# Patient Record
Sex: Female | Born: 1995
Health system: Southern US, Community
[De-identification: ages and names within clinical notes are randomized; demographics above are authoritative.]

## PROBLEM LIST (undated history)

## (undated) DIAGNOSIS — J45909 Unspecified asthma, uncomplicated: Secondary | ICD-10-CM

## (undated) DIAGNOSIS — K219 Gastro-esophageal reflux disease without esophagitis: Secondary | ICD-10-CM

## (undated) HISTORY — PX: ADENOIDECTOMY: SUR15

## (undated) HISTORY — PX: TONSILLECTOMY AND ADENOIDECTOMY: SUR1326

## (undated) HISTORY — PX: APPENDECTOMY: SHX54

---
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
## 2016-01-09 MED FILL — LARIN 24 FE 1 MG-20 MCG TAB: 1-20 | 84 days supply | Qty: 84 | Fill #0

## 2016-06-18 DIAGNOSIS — Z01419 Encounter for gynecological examination (general) (routine) without abnormal findings: Secondary | ICD-10-CM | POA: Diagnosis not present

## 2016-06-18 DIAGNOSIS — Z6839 Body mass index (BMI) 39.0-39.9, adult: Secondary | ICD-10-CM | POA: Diagnosis not present

## 2016-06-20 MED FILL — LARIN 24 FE 1 MG-20 MCG TAB: 1-20 | 84 days supply | Qty: 84 | Fill #0

## 2016-10-17 DIAGNOSIS — N62 Hypertrophy of breast: Secondary | ICD-10-CM | POA: Diagnosis not present

## 2016-10-17 DIAGNOSIS — J4520 Mild intermittent asthma, uncomplicated: Secondary | ICD-10-CM | POA: Diagnosis not present

## 2016-10-17 MED FILL — VENTOLIN HFA 90 MCG INHALER: 108 (90 BAS | 25 days supply | Qty: 18 | Fill #0

## 2017-01-19 DIAGNOSIS — L308 Other specified dermatitis: Secondary | ICD-10-CM | POA: Diagnosis not present

## 2017-05-13 DIAGNOSIS — N62 Hypertrophy of breast: Secondary | ICD-10-CM | POA: Diagnosis not present

## 2017-05-27 DIAGNOSIS — H5213 Myopia, bilateral: Secondary | ICD-10-CM | POA: Diagnosis not present

## 2017-06-19 DIAGNOSIS — Z6836 Body mass index (BMI) 36.0-36.9, adult: Secondary | ICD-10-CM | POA: Diagnosis not present

## 2017-06-19 DIAGNOSIS — Z01419 Encounter for gynecological examination (general) (routine) without abnormal findings: Secondary | ICD-10-CM | POA: Diagnosis not present

## 2017-06-19 MED FILL — NORETHIN-ESTRAD-FERR 1-0.02: 1-20 | 84 days supply | Qty: 84 | Fill #0

## 2018-02-04 DIAGNOSIS — J029 Acute pharyngitis, unspecified: Secondary | ICD-10-CM | POA: Diagnosis not present

## 2018-02-14 DIAGNOSIS — Z23 Encounter for immunization: Secondary | ICD-10-CM | POA: Diagnosis not present

## 2018-02-17 ENCOUNTER — Other Ambulatory Visit: Payer: Self-pay

## 2018-02-17 ENCOUNTER — Encounter (HOSPITAL_BASED_OUTPATIENT_CLINIC_OR_DEPARTMENT_OTHER): Payer: Self-pay | Admitting: *Deleted

## 2018-02-28 MED FILL — CEPHALEXIN 500 MG CAPSULE: 500 | 5 days supply | Qty: 20 | Fill #0

## 2018-03-03 MED FILL — HYDROCODON-APAP 5-325: 5-325 | 7 days supply | Qty: 28 | Fill #0

## 2018-03-05 ENCOUNTER — Encounter (HOSPITAL_BASED_OUTPATIENT_CLINIC_OR_DEPARTMENT_OTHER): Payer: Self-pay | Admitting: Plastic Surgery

## 2018-03-05 ENCOUNTER — Ambulatory Visit: Payer: Self-pay | Admitting: Plastic Surgery

## 2018-03-05 DIAGNOSIS — N62 Hypertrophy of breast: Secondary | ICD-10-CM

## 2018-03-05 NOTE — H&P (Signed)
Tracy Yoder is an 22 y.o. female.   Chief Complaint: mammary hypertrophy HPI: Tracy Yoder is a 22 y.o. female here for pre operative history and physical prior to bilateral breast reduction.  She has a  history of mammary hyperplasia for several years.  She has extremely large breasts causing symptoms that include the following: Back pain (upper and lower) and neck pain. She frequently pins bra cups higher on straps for better lift and relief. Notices relief when holding breast up in her hands. Shoulder straps causing grooves, pain occasionally requiring padding. Pain medication is sometimes required with motrin and tylenol.  Activities that are hindered by enlarged breasts include: running and heavy exercise.  Has seen her PCP for the neck and back pain several times.  Her breasts are extremely large and fairly symmetric.  She has hyperpigmentation of the inframammary area on both sides.  The sternal to nipple distance on the right is 36 and the left is 37.  The IMF distance is 13 cm.  She is 5 feet 1 inches tall and weighs 200 pounds.  Preoperative bra size = 38DDD cup.  The estimated excess breast tissue to be removed at the time of surgery = 600 grams on the left and 600 grams on the right.  Mammogram history: none and no family history of breast cancer.  Past Medical History:  Diagnosis Date  . Asthma   . GERD (gastroesophageal reflux disease)    Past Surgical History:  Procedure Laterality Date  . APPENDECTOMY    . TONSILLECTOMY AND ADENOIDECTOMY      History reviewed. No pertinent family history. Social History:  reports that she has never smoked. She has never used smokeless tobacco. She reports that she does not drink alcohol or use drugs. Allergies: No Known Allergies  No medications prior to admission.    No results found for this or any previous visit (from the past 48 hour(s)). No results found.  Review of Systems  Constitutional: Negative.   HENT: Negative.    Eyes: Negative.   Respiratory: Negative.   Cardiovascular: Negative.   Gastrointestinal: Negative.   Genitourinary: Negative.   Musculoskeletal: Positive for back pain and neck pain.  Skin: Negative.   Neurological: Negative.   Endo/Heme/Allergies: Negative.   Psychiatric/Behavioral: Negative.     Height  (1.575 m), weight 81.6 kg (180 lb), last menstrual period 02/03/2018. Physical Exam  Constitutional: She is oriented to person, place, and time. She appears well-developed and well-nourished.  HENT:  Head: Normocephalic and atraumatic.  Eyes: Pupils are equal, round, and reactive to light. EOM are normal.  Cardiovascular: Normal rate.  Respiratory: Effort normal. No respiratory distress.  GI: Soft. She exhibits no distension.  Musculoskeletal: Normal range of motion.  Neurological: She is alert and oriented to person, place, and time.  Skin: Skin is warm. No rash noted. No erythema. No pallor.  Psychiatric: She has a normal mood and affect. Judgment and thought content normal.     Assessment/Plan Plan for bilateral breast reduction.  Risks and complications were discussed and mom and patient agree to proceed.  [name], DO 03/05/2018, 7:51 AM

## 2018-03-06 ENCOUNTER — Encounter (HOSPITAL_BASED_OUTPATIENT_CLINIC_OR_DEPARTMENT_OTHER): Payer: Self-pay | Admitting: *Deleted

## 2018-03-06 ENCOUNTER — Encounter (HOSPITAL_BASED_OUTPATIENT_CLINIC_OR_DEPARTMENT_OTHER)
Admission: RE | Disposition: A | Discharge status: Home / Self Care | Payer: Self-pay | Source: Ambulatory Visit | Attending: Plastic Surgery

## 2018-03-06 ENCOUNTER — Other Ambulatory Visit: Payer: Self-pay

## 2018-03-06 ENCOUNTER — Ambulatory Visit (HOSPITAL_BASED_OUTPATIENT_CLINIC_OR_DEPARTMENT_OTHER): Payer: 59 | Admitting: Anesthesiology

## 2018-03-06 ENCOUNTER — Ambulatory Visit (HOSPITAL_BASED_OUTPATIENT_CLINIC_OR_DEPARTMENT_OTHER)
Admission: RE | Admit: 2018-03-06 | Discharge: 2018-03-06 | Disposition: A | Discharge status: Home / Self Care | Payer: 59 | Source: Ambulatory Visit | Attending: Plastic Surgery | Admitting: Plastic Surgery

## 2018-03-06 DIAGNOSIS — Z793 Long term (current) use of hormonal contraceptives: Secondary | ICD-10-CM | POA: Insufficient documentation

## 2018-03-06 DIAGNOSIS — M549 Dorsalgia, unspecified: Secondary | ICD-10-CM | POA: Diagnosis not present

## 2018-03-06 DIAGNOSIS — M542 Cervicalgia: Secondary | ICD-10-CM | POA: Diagnosis not present

## 2018-03-06 DIAGNOSIS — N62 Hypertrophy of breast: Secondary | ICD-10-CM | POA: Diagnosis not present

## 2018-03-06 HISTORY — PX: BREAST REDUCTION SURGERY: SHX8

## 2018-03-06 HISTORY — DX: Gastro-esophageal reflux disease without esophagitis: K21.9

## 2018-03-06 HISTORY — DX: Unspecified asthma, uncomplicated: J45.909

## 2018-03-06 SURGERY — MAMMOPLASTY, REDUCTION
Anesthesia: General | Site: Breast | Laterality: Bilateral

## 2018-03-06 MED ORDER — MIDAZOLAM HCL 2 MG/2ML IJ SOLN
INTRAMUSCULAR | Status: AC
  Filled 2018-03-06: qty 2

## 2018-03-06 MED ORDER — PROPOFOL 10 MG/ML IV BOLUS
INTRAVENOUS | Status: DC | PRN
  Administered 2018-03-06: 200 mg via INTRAVENOUS

## 2018-03-06 MED ORDER — SCOPOLAMINE 1 MG/3DAYS TD PT72: 1.0000 | MEDICATED_PATCH | TRANSDERMAL | Status: DC

## 2018-03-06 MED ORDER — LACTATED RINGERS IV SOLN: 500.0000 mL | INTRAVENOUS | Status: DC

## 2018-03-06 MED ORDER — SUFENTANIL CITRATE 50 MCG/ML IV SOLN
INTRAVENOUS | Status: AC
  Filled 2018-03-06: qty 1

## 2018-03-06 MED ORDER — EPHEDRINE SULFATE 50 MG/ML IJ SOLN
INTRAMUSCULAR | Status: DC | PRN
  Administered 2018-03-06: 10 mg via INTRAVENOUS

## 2018-03-06 MED ORDER — SCOPOLAMINE 1 MG/3DAYS TD PT72
MEDICATED_PATCH | TRANSDERMAL | Status: AC
  Filled 2018-03-06: qty 1

## 2018-03-06 MED ORDER — CEFAZOLIN SODIUM-DEXTROSE 2-4 GM/100ML-% IV SOLN
INTRAVENOUS | Status: AC
  Filled 2018-03-06: qty 100

## 2018-03-06 MED ORDER — DEXAMETHASONE SODIUM PHOSPHATE 10 MG/ML IJ SOLN
INTRAMUSCULAR | Status: AC
  Filled 2018-03-06: qty 1

## 2018-03-06 MED ORDER — POLYMYXIN B SULFATE 500000 UNITS IJ SOLR
INTRAMUSCULAR | Status: DC | PRN
  Administered 2018-03-06: 500 mL

## 2018-03-06 MED ORDER — SUGAMMADEX SODIUM 200 MG/2ML IV SOLN
INTRAVENOUS | Status: AC
  Filled 2018-03-06: qty 2

## 2018-03-06 MED ORDER — DEXAMETHASONE SODIUM PHOSPHATE 4 MG/ML IJ SOLN
INTRAMUSCULAR | Status: DC | PRN
  Administered 2018-03-06: 10 mg via INTRAVENOUS

## 2018-03-06 MED ORDER — FENTANYL CITRATE (PF) 100 MCG/2ML IJ SOLN: 50.0000 ug | INTRAMUSCULAR | Status: DC | PRN

## 2018-03-06 MED ORDER — MIDAZOLAM HCL 2 MG/2ML IJ SOLN
1.0000 mg | INTRAMUSCULAR | Status: DC | PRN
  Administered 2018-03-06: 2 mg via INTRAVENOUS

## 2018-03-06 MED ORDER — HYDROMORPHONE HCL 1 MG/ML IJ SOLN
INTRAMUSCULAR | Status: AC
  Filled 2018-03-06: qty 0.5

## 2018-03-06 MED ORDER — PROPOFOL 500 MG/50ML IV EMUL
INTRAVENOUS | Status: AC
  Filled 2018-03-06: qty 50

## 2018-03-06 MED ORDER — SCOPOLAMINE 1 MG/3DAYS TD PT72
1.0000 | MEDICATED_PATCH | Freq: Once | TRANSDERMAL | Status: DC | PRN
  Administered 2018-03-06: 1.5 mg via TRANSDERMAL

## 2018-03-06 MED ORDER — LACTATED RINGERS IV SOLN
INTRAVENOUS | Status: DC
  Administered 2018-03-06 (×2): via INTRAVENOUS

## 2018-03-06 MED ORDER — ONDANSETRON HCL 4 MG/2ML IJ SOLN
INTRAMUSCULAR | Status: AC
  Filled 2018-03-06: qty 2

## 2018-03-06 MED ORDER — SUFENTANIL CITRATE 50 MCG/ML IV SOLN
INTRAVENOUS | Status: DC | PRN
  Administered 2018-03-06 (×5): 10 ug via INTRAVENOUS

## 2018-03-06 MED ORDER — LIDOCAINE HCL (CARDIAC) PF 100 MG/5ML IV SOSY
PREFILLED_SYRINGE | INTRAVENOUS | Status: DC | PRN
  Administered 2018-03-06: 100 mg via INTRAVENOUS

## 2018-03-06 MED ORDER — BUPIVACAINE-EPINEPHRINE (PF) 0.25% -1:200000 IJ SOLN
INTRAMUSCULAR | Status: AC
  Filled 2018-03-06: qty 60

## 2018-03-06 MED ORDER — LIDOCAINE HCL (CARDIAC) PF 100 MG/5ML IV SOSY
PREFILLED_SYRINGE | INTRAVENOUS | Status: AC
  Filled 2018-03-06: qty 5

## 2018-03-06 MED ORDER — BUPIVACAINE-EPINEPHRINE 0.25% -1:200000 IJ SOLN
INTRAMUSCULAR | Status: DC | PRN
  Administered 2018-03-06: 30 mL

## 2018-03-06 MED ORDER — ROCURONIUM BROMIDE 100 MG/10ML IV SOLN
INTRAVENOUS | Status: DC | PRN
  Administered 2018-03-06: 70 mg via INTRAVENOUS
  Administered 2018-03-06: 20 mg via INTRAVENOUS

## 2018-03-06 MED ORDER — ROCURONIUM BROMIDE 10 MG/ML (PF) SYRINGE
PREFILLED_SYRINGE | INTRAVENOUS | Status: AC
  Filled 2018-03-06: qty 5

## 2018-03-06 MED ORDER — PROMETHAZINE HCL 25 MG/ML IJ SOLN: 6.2500 mg | INTRAMUSCULAR | Status: DC | PRN

## 2018-03-06 MED ORDER — SUGAMMADEX SODIUM 200 MG/2ML IV SOLN
INTRAVENOUS | Status: DC | PRN
  Administered 2018-03-06: 200 mg via INTRAVENOUS

## 2018-03-06 MED ORDER — SODIUM CHLORIDE 0.9 % IV SOLN
250.0000 mL | INTRAVENOUS | Status: DC | PRN
Start: 2018-03-06 — End: 2018-03-06

## 2018-03-06 MED ORDER — HYDROMORPHONE HCL 1 MG/ML IJ SOLN
0.2500 mg | INTRAMUSCULAR | Status: DC | PRN
  Administered 2018-03-06: 0.5 mg via INTRAVENOUS

## 2018-03-06 MED ORDER — OXYCODONE HCL 5 MG PO TABS
ORAL_TABLET | ORAL | Status: AC
  Filled 2018-03-06: qty 2

## 2018-03-06 MED ORDER — SODIUM CHLORIDE 0.9 % IV SOLN
INTRAVENOUS | Status: AC | PRN
  Administered 2018-03-06: 100 mL

## 2018-03-06 MED ORDER — OXYCODONE HCL 5 MG PO TABS
5.0000 mg | ORAL_TABLET | ORAL | Status: DC | PRN
Start: 2018-03-06 — End: 2018-03-06
  Administered 2018-03-06: 10 mg via ORAL

## 2018-03-06 MED ORDER — EPHEDRINE SULFATE 50 MG/ML IJ SOLN
INTRAMUSCULAR | Status: AC
  Filled 2018-03-06: qty 1

## 2018-03-06 MED ORDER — LIDOCAINE HCL (PF) 1 % IJ SOLN
INTRAMUSCULAR | Status: AC
  Filled 2018-03-06: qty 30

## 2018-03-06 MED ORDER — SODIUM CHLORIDE 0.9% FLUSH: 3.0000 mL | INTRAVENOUS | Status: DC | PRN

## 2018-03-06 MED ORDER — EPINEPHRINE 30 MG/30ML IJ SOLN
INTRAMUSCULAR | Status: AC
Start: 2018-03-06 — End: 2018-03-06
  Filled 2018-03-06: qty 1

## 2018-03-06 MED ORDER — ONDANSETRON HCL 4 MG/2ML IJ SOLN
INTRAMUSCULAR | Status: DC | PRN
Start: 2018-03-06 — End: 2018-03-06
  Administered 2018-03-06: 4 mg via INTRAVENOUS

## 2018-03-06 MED ORDER — CEFAZOLIN SODIUM-DEXTROSE 2-4 GM/100ML-% IV SOLN
2.0000 g | INTRAVENOUS | Status: AC
  Administered 2018-03-06: 2 g via INTRAVENOUS

## 2018-03-06 MED ORDER — PHENYLEPHRINE 40 MCG/ML (10ML) SYRINGE FOR IV PUSH (FOR BLOOD PRESSURE SUPPORT)
PREFILLED_SYRINGE | INTRAVENOUS | Status: AC
  Filled 2018-03-06: qty 10

## 2018-03-06 MED ORDER — SUCCINYLCHOLINE CHLORIDE 200 MG/10ML IV SOSY
PREFILLED_SYRINGE | INTRAVENOUS | Status: AC
  Filled 2018-03-06: qty 10

## 2018-03-06 MED ORDER — SODIUM CHLORIDE 0.9% FLUSH: 3.0000 mL | Freq: Two times a day (BID) | INTRAVENOUS | Status: DC

## 2018-03-06 SURGICAL SUPPLY — 65 items
ADH SKN CLS APL DERMABOND .7 (GAUZE/BANDAGES/DRESSINGS) ×2
BAG DECANTER FOR FLEXI CONT (MISCELLANEOUS) ×3 IMPLANT
BINDER BREAST LRG (GAUZE/BANDAGES/DRESSINGS) IMPLANT
BINDER BREAST MEDIUM (GAUZE/BANDAGES/DRESSINGS) IMPLANT
BINDER BREAST XLRG (GAUZE/BANDAGES/DRESSINGS) IMPLANT
BINDER BREAST XXLRG (GAUZE/BANDAGES/DRESSINGS) IMPLANT
BIOPATCH RED 1 DISK 7.0 (GAUZE/BANDAGES/DRESSINGS) IMPLANT
BIOPATCH RED 1IN DISK 7.0MM (GAUZE/BANDAGES/DRESSINGS)
BLADE HEX COATED 2.75 (ELECTRODE) ×3 IMPLANT
BLADE KNIFE PERSONA 10 (BLADE) ×8 IMPLANT
BLADE SURG 15 STRL LF DISP TIS (BLADE) IMPLANT
BLADE SURG 15 STRL SS (BLADE) ×3
BNDG GAUZE ELAST 4 BULKY (GAUZE/BANDAGES/DRESSINGS) ×6 IMPLANT
CANISTER SUCT 1200ML W/VALVE (MISCELLANEOUS) ×3 IMPLANT
CHLORAPREP W/TINT 26ML (MISCELLANEOUS) ×3 IMPLANT
COVER BACK TABLE 60X90IN (DRAPES) ×3 IMPLANT
COVER MAYO STAND STRL (DRAPES) ×3 IMPLANT
DECANTER SPIKE VIAL GLASS SM (MISCELLANEOUS) IMPLANT
DERMABOND ADVANCED (GAUZE/BANDAGES/DRESSINGS) ×4
DERMABOND ADVANCED .7 DNX12 (GAUZE/BANDAGES/DRESSINGS) IMPLANT
DRAIN CHANNEL 19F RND (DRAIN) IMPLANT
DRAPE LAPAROSCOPIC ABDOMINAL (DRAPES) ×3 IMPLANT
DRSG PAD ABDOMINAL 8X10 ST (GAUZE/BANDAGES/DRESSINGS) ×6 IMPLANT
ELECT BLADE 4.0 EZ CLEAN MEGAD (MISCELLANEOUS)
ELECT REM PT RETURN 9FT ADLT (ELECTROSURGICAL) ×3
ELECTRODE BLDE 4.0 EZ CLN MEGD (MISCELLANEOUS) IMPLANT
ELECTRODE REM PT RTRN 9FT ADLT (ELECTROSURGICAL) ×1 IMPLANT
EVACUATOR SILICONE 100CC (DRAIN) ×4 IMPLANT
GAUZE SPONGE 4X4 12PLY STRL LF (GAUZE/BANDAGES/DRESSINGS) ×3 IMPLANT
GLOVE BIO SURGEON STRL SZ 6.5 (GLOVE) ×6 IMPLANT
GLOVE BIO SURGEONS STRL SZ 6.5 (GLOVE) ×3
GOWN STRL REUS W/ TWL LRG LVL3 (GOWN DISPOSABLE) ×3 IMPLANT
GOWN STRL REUS W/TWL LRG LVL3 (GOWN DISPOSABLE) ×9
NDL HYPO 25X1 1.5 SAFETY (NEEDLE) ×1 IMPLANT
NDL SAFETY ECLIPSE 18X1.5 (NEEDLE) IMPLANT
NEEDLE HYPO 18GX1.5 SHARP (NEEDLE)
NEEDLE HYPO 25X1 1.5 SAFETY (NEEDLE) ×3 IMPLANT
NS IRRIG 1000ML POUR BTL (IV SOLUTION) IMPLANT
PACK BASIN DAY SURGERY FS (CUSTOM PROCEDURE TRAY) ×3 IMPLANT
PAD ALCOHOL SWAB (MISCELLANEOUS) IMPLANT
PENCIL BUTTON HOLSTER BLD 10FT (ELECTRODE) ×3 IMPLANT
PIN SAFETY STERILE (MISCELLANEOUS) ×4 IMPLANT
SLEEVE SCD COMPRESS KNEE MED (MISCELLANEOUS) ×3 IMPLANT
SPONGE LAP 18X18 RF (DISPOSABLE) ×8 IMPLANT
STRIP SUTURE WOUND CLOSURE 1/2 (SUTURE) ×6 IMPLANT
SUT MNCRL AB 4-0 PS2 18 (SUTURE) ×8 IMPLANT
SUT MON AB 3-0 SH 27 (SUTURE) ×9
SUT MON AB 3-0 SH27 (SUTURE) ×1 IMPLANT
SUT MON AB 5-0 PS2 18 (SUTURE) ×6 IMPLANT
SUT PDS 3-0 CT2 (SUTURE)
SUT PDS AB 2-0 CT2 27 (SUTURE) IMPLANT
SUT PDS II 3-0 CT2 27 ABS (SUTURE) IMPLANT
SUT SILK 3 0 PS 1 (SUTURE) IMPLANT
SYR 3ML 23GX1 SAFETY (SYRINGE) ×1 IMPLANT
SYR 50ML LL SCALE MARK (SYRINGE) IMPLANT
SYR BULB IRRIGATION 50ML (SYRINGE) ×3 IMPLANT
SYR CONTROL 10ML LL (SYRINGE) ×3 IMPLANT
TAPE MEASURE VINYL STERILE (MISCELLANEOUS) ×3 IMPLANT
TOWEL OR 17X24 6PK STRL BLUE (TOWEL DISPOSABLE) ×6 IMPLANT
TUBE CONNECTING 20'X1/4 (TUBING) ×1
TUBE CONNECTING 20X1/4 (TUBING) ×2 IMPLANT
TUBING INFILTRATION IT-10001 (TUBING) IMPLANT
TUBING SET GRADUATE ASPIR 12FT (MISCELLANEOUS) ×2 IMPLANT
UNDERPAD 30X30 (UNDERPADS AND DIAPERS) ×6 IMPLANT
YANKAUER SUCT BULB TIP NO VENT (SUCTIONS) ×3 IMPLANT

## 2018-03-06 NOTE — Interval H&P Note (Signed)
History and Physical Interval Note:  03/06/2018 7:10 AM  Tracy Yoder  has presented today for surgery, with the diagnosis of MAMMARY  HYPERTROPHY  The various methods of treatment have been discussed with the patient and family. After consideration of risks, benefits and other options for treatment, the patient has consented to  Procedure(s): BILATERAL MAMMARY REDUCTION  (BREAST) (Bilateral) as a surgical intervention .  The patient's history has been reviewed, patient examined, no change in status, stable for surgery.  I have reviewed the patient's chart and labs.  Questions were answered to the patient's satisfaction.     [name]

## 2018-03-06 NOTE — Anesthesia Preprocedure Evaluation (Addendum)
Anesthesia Evaluation  Patient identified by MRN, date of birth, ID band Patient awake    Reviewed: Allergy & Precautions, NPO status , Patient's Chart, lab work & pertinent test results  History of Anesthesia Complications Negative for: history of anesthetic complications  Airway Mallampati: II  TM Distance: >3 FB Neck ROM: Full    Dental no notable dental hx. (+) Dental Advisory Given   Pulmonary asthma ,    Pulmonary exam normal        Cardiovascular negative cardio ROS Normal cardiovascular exam     Neuro/Psych negative neurological ROS     GI/Hepatic Neg liver ROS, GERD  ,  Endo/Other  negative endocrine ROS  Renal/GU negative Renal ROS     Musculoskeletal negative musculoskeletal ROS (+)   Abdominal   Peds  Hematology negative hematology ROS (+)   Anesthesia Other Findings Day of surgery medications reviewed with the patient.  Reproductive/Obstetrics                            Anesthesia Physical Anesthesia Plan  ASA: II  Anesthesia Plan: General   Post-op Pain Management:    Induction: Intravenous  PONV Risk Score and Plan: 3 and Ondansetron, Dexamethasone and Scopolamine patch - Pre-op  Airway Management Planned: Oral ETT  Additional Equipment:   Intra-op Plan:   Post-operative Plan: Extubation in OR  Informed Consent: I have reviewed the patients History and Physical, chart, labs and discussed the procedure including the risks, benefits and alternatives for the proposed anesthesia with the patient or authorized representative who has indicated his/her understanding and acceptance.   Dental advisory given  Plan Discussed with: Anesthesiologist, CRNA and Surgeon  Anesthesia Plan Comments:        Anesthesia Quick Evaluation

## 2018-03-06 NOTE — Anesthesia Procedure Notes (Signed)

## 2018-03-06 NOTE — Discharge Instructions (Addendum)
No heavy lifting. Skin bath or baby wipes. Continue binder or sports bra. Oxy  received at 51 Noon!!!!!  Post Anesthesia Home Care Instructions  Activity: Get plenty of rest for the remainder of the day. A responsible individual must stay with you for 24 hours following the procedure.  For the next 24 hours, DO NOT: -Drive a car -Advertising copywriter -Drink alcoholic beverages -Take any medication unless instructed by your physician -Make any legal decisions or sign important papers.  Meals: Start with liquid foods such as gelatin or soup. Progress to regular foods as tolerated. Avoid greasy, spicy, heavy foods. If nausea and/or vomiting occur, drink only clear liquids until the nausea and/or vomiting subsides. Call your physician if vomiting continues.  Special Instructions/Symptoms: Your throat may feel dry or sore from the anesthesia or the breathing tube placed in your throat during surgery. If this causes discomfort, gargle with warm salt water. The discomfort should disappear within 24 hours.  If you had a scopolamine patch placed behind your ear for the management of post- operative nausea and/or vomiting:  1. The medication in the patch is effective for 72 hours, after which it should be removed.  Wrap patch in a tissue and discard in the trash. Wash hands thoroughly with soap and water. 2. You may remove the patch earlier than 72 hours if you experience unpleasant side effects which may include dry mouth, dizziness or visual disturbances. 3. Avoid touching the patch. Wash your hands with soap and water after contact with the patch.  About my Jackson-Pratt Bulb Drain  What is a Jackson-Pratt bulb? A Jackson-Pratt is a soft, round device used to collect drainage. It is connected to a long, thin drainage catheter, which is held in place by one or two small stiches near your surgical incision site. When the bulb is squeezed, it forms a vacuum, forcing the drainage to empty into  the bulb.  Emptying the Jackson-Pratt bulb- To empty the bulb: 1. Release the plug on the top of the bulb. 2. Pour the bulb's contents into a measuring container which your nurse will provide. 3. Record the time emptied and amount of drainage. Empty the drain(s) as often as your     doctor or nurse recommends.  Date                  Time                    Amount (Drain 1)                 Amount (Drain 2)  _____________________________________________________________________  _____________________________________________________________________  _____________________________________________________________________  _____________________________________________________________________  _____________________________________________________________________  _____________________________________________________________________  _____________________________________________________________________  _____________________________________________________________________  Squeezing the Jackson-Pratt Bulb- To squeeze the bulb: 1. Make sure the plug at the top of the bulb is open. 2. Squeeze the bulb tightly in your fist. You will hear air squeezing from the bulb. 3. Replace the plug while the bulb is squeezed. 4. Use a safety pin to attach the bulb to your clothing. This will keep the catheter from     pulling at the bulb insertion site.  When to call your doctor- Call your doctor if:  Drain site becomes red, swollen or hot.  You have a fever greater than 101 degrees F.  There is oozing at the drain site.  Drain falls out (apply a guaze bandage over the drain hole and secure it with tape).  Drainage increases daily not related to activity patterns. (You  will usually have more drainage when you are active than when you are resting.)  Drainage has a bad odor.

## 2018-03-06 NOTE — Transfer of Care (Signed)
Immediate Anesthesia Transfer of Care Note  Patient: Tracy Yoder  Procedure(s) Performed: BILATERAL MAMMARY REDUCTION  (BREAST) (Bilateral Breast)  Patient Location: PACU  Anesthesia Type:General  Level of Consciousness: awake, alert  and oriented  Airway & Oxygen Therapy: Patient Spontanous Breathing and Patient connected to face mask oxygen  Post-op Assessment: Report given to RN and Post -op Vital signs reviewed and stable  Post vital signs: Reviewed and stable  Last Vitals:  Vitals Value Taken Time  BP 157/85 03/06/2018 11:02 AM  Temp    Pulse 130 03/06/2018 11:06 AM  Resp 14 03/06/2018 11:06 AM  SpO2 97 % 03/06/2018 11:06 AM  Vitals shown include unvalidated device data.  Last Pain:  Vitals:   03/06/18 0641  TempSrc: Oral         Complications: No apparent anesthesia complications

## 2018-03-06 NOTE — Anesthesia Postprocedure Evaluation (Signed)
Anesthesia Post Note  Patient: Tracy Yoder  Procedure(s) Performed: BILATERAL MAMMARY REDUCTION  (BREAST) (Bilateral Breast)     Patient location during evaluation: PACU Anesthesia Type: General Level of consciousness: sedated Pain management: pain level controlled Vital Signs Assessment: post-procedure vital signs reviewed and stable Respiratory status: spontaneous breathing and respiratory function stable Cardiovascular status: stable Postop Assessment: no apparent nausea or vomiting Anesthetic complications: no    Last Vitals:  Vitals:   03/06/18 1145 03/06/18 1159  BP: 127/86 110/88  Pulse: (!) 114 (!) 59  Resp: (!) 23 18  Temp:  36.9 C  SpO2: 99% 100%    Last Pain:  Vitals:   03/06/18 1159  TempSrc:   PainSc: 3                  Tracy Yoder

## 2018-03-06 NOTE — Op Note (Addendum)
Breast Reduction Op note:    DATE OF PROCEDURE: 03/06/2018  LOCATION: [facility]  SURGEON: Alan Ripper Sanger Dillingham, DO  ASSISTANT: Shawn Rayburn, PA  PREOPERATIVE DIAGNOSIS 1. Macromastia 2. Neck Pain 3. Back Pain  POSTOPERATIVE DIAGNOSIS 1. Macromastia 2. Neck Pain 3. Back Pain  PROCEDURES 1. Bilateral breast reduction.  Right reduction 801g, Left reduction 825g  COMPLICATIONS: None.  DRAINS: bilateral  INDICATIONS FOR PROCEDURE Tracy Yoder is a 22 y.o. year-old female born on [birth year],with a history of symptomatic macromastia with concominant back pain, neck pain, shoulder grooving from her bra.   MRN: [id number]  CONSENT Informed consent was obtained directly from the patient. The risks, benefits and alternatives were fully discussed. Specific risks including but not limited to bleeding, infection, hematoma, seroma, scarring, pain, nipple necrosis, asymmetry, poor cosmetic results, and need for further surgery were discussed. The patient had ample opportunity to have her questions answered to her satisfaction.  DESCRIPTION OF PROCEDURE  Patient was brought into the operating room and placed in a supine position.  SCDs were placed and appropriate padding was performed.  Antibiotics were given. The patient underwent general anesthesia and the chest was prepped and draped in a sterile fashion.  A timeout was performed and all information was confirmed to be correct.  Right side: Preoperative markings were confirmed.  Incision lines were injected with 1% Xylocaine with epinephrine.  After waiting for vasoconstriction, the marked lines were incised.  A Wise-pattern superomedial breast reduction was performed by de-epithelializing the pedicle, using bovie to create the superomedial pedicle, and removing breast tissue from the superior, lateral, and inferior portions of the breast.  Liposuction was done to the lateral inferior area. Care was taken to  not undermine the breast pedicle. Hemostasis was achieved.  The nipple was gently rotated into position and the soft tissue closed with 4-0 Monocryl.   The pocket was irrigated and hemostasis confirmed.  The deep tissues were approximated with 3-0 monocryl sutures and the skin was closed with deep dermal and subcuticular 4-0 Monocryl sutures.  The nipple and skin flaps had good capillary refill at the end of the procedure.    Left side: Preoperative markings were confirmed.  Incision lines were injected with 1% Xylocaine with epinephrine.  After waiting for vasoconstriction, the marked lines were incised.  A Wise-pattern superomedial breast reduction was performed by de-epithelializing the pedicle, using bovie to create the superomedial pedicle, and removing breast tissue from the superior, lateral, and inferior portions of the breast.  Care was taken to not undermine the breast pedicle. Hemostasis was achieved.  The nipple was gently rotated into position and the soft tissue was closed with 4-0 Monocryl.  The patient was sat upright and size and shape symmetry was confirmed.  The pocket was irrigated and hemostasis confirmed.  The deep tissues were approximated with 3-0 monocryl sutures and the skin was closed with deep dermal and subcuticular 4-0 Monocryl sutures.  Dermabond was applied.  A breast binder and ABDs were placed.  The nipple and skin flaps had good capillary refill at the end of the procedure.  The patient tolerated the procedure well. The patient was allowed to wake from anesthesia and taken to the recovery room in satisfactory condition. The advanced practice practitioner assisted throughout the case.  The APP was essential in retraction and counter traction when needed to make the case progress smoothly.  The assistance was needed for blood control, tissue re-approximation and assisted with closure of the incision site.

## 2018-03-07 ENCOUNTER — Encounter (HOSPITAL_BASED_OUTPATIENT_CLINIC_OR_DEPARTMENT_OTHER): Payer: Self-pay | Admitting: Plastic Surgery

## 2018-03-07 NOTE — Op Note (Signed)
First Assist Op Note: [facility] I assisted the Surgeon(s) ___Dr. Alan Ripper Dillingham__ on the procedure(s): ___Bilateral breast reduction _______on Date ___5/02/19______  I provided my assistance on this case as follows:  I was present and acted as first Geophysicist/field seismologist during this operation. I was present during the patient transport into the operative suite and assisted the OR staff with transferring and positioning of the patient. All extremities were checked and properly cushioned and safety straps in place. I was involved in the prepping and placement of sterile drapes. A time out was performed and all information confirmed to be correct.  I first assisted during the case including retraction for exposure, assisting with closure of surgical wounds and application of sterile dressings. I provided assistance with application of post operative garments/splinting and assisted with patient transfer back to the stretcher as needed.   Fritz Cauthon,PA-C Plastic Surgery [phone]

## 2018-03-12 MED FILL — HYDROCODON-APAP 5-325: 5-325 | 7 days supply | Qty: 28 | Fill #0

## 2018-04-16 DIAGNOSIS — H5213 Myopia, bilateral: Secondary | ICD-10-CM | POA: Diagnosis not present

## 2018-06-20 DIAGNOSIS — Z01419 Encounter for gynecological examination (general) (routine) without abnormal findings: Secondary | ICD-10-CM | POA: Diagnosis not present

## 2018-06-20 DIAGNOSIS — N390 Urinary tract infection, site not specified: Secondary | ICD-10-CM | POA: Diagnosis not present

## 2018-06-20 DIAGNOSIS — Z6836 Body mass index (BMI) 36.0-36.9, adult: Secondary | ICD-10-CM | POA: Diagnosis not present

## 2018-06-20 DIAGNOSIS — Z113 Encounter for screening for infections with a predominantly sexual mode of transmission: Secondary | ICD-10-CM | POA: Diagnosis not present

## 2018-06-20 DIAGNOSIS — B373 Candidiasis of vulva and vagina: Secondary | ICD-10-CM | POA: Diagnosis not present

## 2018-06-20 MED FILL — FLUCONAZOLE 150 MG TABS: 150 | 1 days supply | Qty: 1 | Fill #0

## 2018-06-23 ENCOUNTER — Other Ambulatory Visit: Payer: Self-pay | Admitting: Obstetrics and Gynecology

## 2018-06-23 DIAGNOSIS — N6320 Unspecified lump in the left breast, unspecified quadrant: Secondary | ICD-10-CM

## 2018-06-30 ENCOUNTER — Other Ambulatory Visit: Payer: 59

## 2018-07-18 DIAGNOSIS — Z3202 Encounter for pregnancy test, result negative: Secondary | ICD-10-CM | POA: Diagnosis not present

## 2018-07-18 DIAGNOSIS — Z30430 Encounter for insertion of intrauterine contraceptive device: Secondary | ICD-10-CM | POA: Diagnosis not present

## 2018-08-08 DIAGNOSIS — N926 Irregular menstruation, unspecified: Secondary | ICD-10-CM | POA: Diagnosis not present

## 2018-08-08 DIAGNOSIS — R102 Pelvic and perineal pain: Secondary | ICD-10-CM | POA: Diagnosis not present

## 2018-08-08 DIAGNOSIS — Z30431 Encounter for routine checking of intrauterine contraceptive device: Secondary | ICD-10-CM | POA: Diagnosis not present

## 2018-09-05 ENCOUNTER — Encounter: Payer: Self-pay | Admitting: Plastic Surgery

## 2018-09-05 ENCOUNTER — Ambulatory Visit (INDEPENDENT_AMBULATORY_CARE_PROVIDER_SITE_OTHER): Payer: 59 | Admitting: Plastic Surgery

## 2018-09-05 ENCOUNTER — Telehealth: Payer: Self-pay | Admitting: Plastic Surgery

## 2018-09-05 VITALS — BP 124/70 | HR 83 | Resp 13 | Ht 61.0 in | Wt 190.0 lb

## 2018-09-05 DIAGNOSIS — Z9889 Other specified postprocedural states: Secondary | ICD-10-CM | POA: Diagnosis not present

## 2018-09-05 DIAGNOSIS — N641 Fat necrosis of breast: Secondary | ICD-10-CM | POA: Insufficient documentation

## 2018-09-05 NOTE — Telephone Encounter (Signed)
Patient's mother called and wants to know what options for surgery there are for the patient. Patient's mother also requesting that if surgery is decided on that it be before the end of the year.

## 2018-09-05 NOTE — Progress Notes (Signed)
     Patient ID: Tracy Yoder, female    DOB: 06-Aug-1996, 22 y.o.   MRN: [id number]   Chief Complaint  Patient presents with  . Breast Problem    The patient is a 22 year old white female here for further evaluation of her bilateral breast reduction.  She had the surgery in May and overall is very happy with her breasts and the way that they look.  She saw her OB/GYN recently and the doctor noticed to firm areas in the left breast.  We have been watching these and they are most likely fat necrosis.  However they have not gotten softer.  One is at the 12 o'clock position of the left breast and the other is at the 3 o'clock position slightly laterally.  The right side I do not feel anything of concern.  She is not worried about it.  She does want to do something.  She is otherwise doing quite well.  Although today she has a little laryngitis and some postnasal drip.   Review of Systems  Constitutional: Negative.   HENT: Positive for postnasal drip and sore throat.   Eyes: Negative.   Respiratory: Negative.   Cardiovascular: Negative.   Gastrointestinal: Negative.   Endocrine: Negative.   Genitourinary: Negative for difficulty urinating.  Musculoskeletal: Negative.   Skin: Negative.   Neurological: Negative.   Psychiatric/Behavioral: Negative.     Past Medical History:  Diagnosis Date  . Asthma   . GERD (gastroesophageal reflux disease)     Past Surgical History:  Procedure Laterality Date  . APPENDECTOMY    . BREAST REDUCTION SURGERY Bilateral 03/06/2018   Procedure: BILATERAL MAMMARY REDUCTION  (BREAST);  Surgeon: Peggye Form, DO;  Location: [facility];  Service: Plastics;  Laterality: Bilateral;  . TONSILLECTOMY AND ADENOIDECTOMY        Current Outpatient Medications:  .  Norethin Ace-Eth Estrad-FE (LOESTRIN 24 FE PO), Take by mouth., Disp: , Rfl:    Objective:   There were no vitals filed for this visit.  Physical Exam  Constitutional: She is  oriented to person, place, and time. She appears well-developed and well-nourished.  HENT:  Head: Normocephalic and atraumatic.  Eyes: Pupils are equal, round, and reactive to light. EOM are normal.  Cardiovascular: Normal rate.  Pulmonary/Chest: Effort normal.    Neurological: She is alert and oriented to person, place, and time.  Skin: Skin is warm. No erythema.    Assessment & Plan:  History of bilateral breast reduction surgery  We discussed options for excision, massage and kenalog injection.  The OB/GYN has ordered a mammogram and possible u/s.  This is reasonable and I encouraged her to follow through with the exam.  She would like to talk to mom about the options and let me know what she decides.    Alena Bills Marzelle Rutten, DO

## 2018-09-06 NOTE — Telephone Encounter (Signed)
Called and tried to leave message but mailbox full.

## 2018-09-09 DIAGNOSIS — B349 Viral infection, unspecified: Secondary | ICD-10-CM | POA: Diagnosis not present

## 2018-09-18 DIAGNOSIS — H10023 Other mucopurulent conjunctivitis, bilateral: Secondary | ICD-10-CM | POA: Diagnosis not present

## 2018-09-18 DIAGNOSIS — Z23 Encounter for immunization: Secondary | ICD-10-CM | POA: Diagnosis not present

## 2018-09-19 ENCOUNTER — Encounter: Payer: Self-pay | Admitting: Plastic Surgery

## 2018-09-19 ENCOUNTER — Ambulatory Visit (INDEPENDENT_AMBULATORY_CARE_PROVIDER_SITE_OTHER): Payer: 59 | Admitting: Plastic Surgery

## 2018-09-19 DIAGNOSIS — N641 Fat necrosis of breast: Secondary | ICD-10-CM

## 2018-09-19 NOTE — Progress Notes (Signed)
   Subjective:    Patient ID: Tracy Yoder, female    DOB: 1996/08/05, 22 y.o.   MRN: [id number]  The patient is a 22 yrs old female here with her mom for discussion about her breast reduction.  Overall she has done very well from the surgery in May and she is happy with the size and symmetry.  The left breast has two areas of very firm tissue that are consistent with fat necrosis.  It has not gotten better over the past 5 months.  It is very firm and feels like it is under the skin at the 12 o'clock position and then laterally.  The right side is soft and does not have any necrosis palpable.    Review of Systems  Constitutional: Negative.   HENT: Negative.   Eyes: Negative.   Respiratory: Negative.   Cardiovascular: Negative.   Gastrointestinal: Negative.   Endocrine: Negative.   Genitourinary: Negative.   Musculoskeletal: Negative.   Skin: Negative.   Hematological: Negative.   Psychiatric/Behavioral: Negative.        Objective:   Physical Exam  Constitutional: She appears well-developed and well-nourished.  HENT:  Head: Normocephalic and atraumatic.  Eyes: Pupils are equal, round, and reactive to light. EOM are normal.  Neurological: She is alert.  Skin: Skin is warm. No erythema. No pallor.  Psychiatric: She has a normal mood and affect. Her behavior is normal. Judgment and thought content normal.      Assessment & Plan:  Breast, fat necrosis  We discussed multiple options for over 30 minutes for repair of the firm areas.  The mom and patient have decided to undergo excision of the fat necrosis.  I think this is a reasonable option and most likely what is needed at this time.  They are aware there is a risk of creating an indentation in the area.  This should improve in time but could require additional surgery like fat grafting.

## 2018-10-01 ENCOUNTER — Inpatient Hospital Stay: Admission: RE | Admit: 2018-10-01 | Payer: 59 | Source: Ambulatory Visit

## 2018-10-23 ENCOUNTER — Ambulatory Visit
Admission: RE | Admit: 2018-10-23 | Discharge: 2018-10-23 | Disposition: A | Discharge status: Home / Self Care | Payer: 59 | Source: Ambulatory Visit | Attending: Obstetrics and Gynecology | Admitting: Obstetrics and Gynecology

## 2018-10-23 DIAGNOSIS — N6321 Unspecified lump in the left breast, upper outer quadrant: Secondary | ICD-10-CM | POA: Diagnosis not present

## 2018-10-23 DIAGNOSIS — N6322 Unspecified lump in the left breast, upper inner quadrant: Secondary | ICD-10-CM | POA: Diagnosis not present

## 2018-10-23 DIAGNOSIS — Z30431 Encounter for routine checking of intrauterine contraceptive device: Secondary | ICD-10-CM | POA: Diagnosis not present

## 2018-10-23 DIAGNOSIS — N6320 Unspecified lump in the left breast, unspecified quadrant: Secondary | ICD-10-CM

## 2018-10-24 ENCOUNTER — Encounter (HOSPITAL_BASED_OUTPATIENT_CLINIC_OR_DEPARTMENT_OTHER): Payer: Self-pay | Admitting: *Deleted

## 2018-10-24 ENCOUNTER — Other Ambulatory Visit: Payer: Self-pay

## 2018-10-30 ENCOUNTER — Encounter (HOSPITAL_BASED_OUTPATIENT_CLINIC_OR_DEPARTMENT_OTHER)
Admission: RE | Disposition: A | Discharge status: Home / Self Care | Payer: Self-pay | Source: Home / Self Care | Attending: Plastic Surgery

## 2018-10-30 ENCOUNTER — Encounter (HOSPITAL_BASED_OUTPATIENT_CLINIC_OR_DEPARTMENT_OTHER): Payer: Self-pay

## 2018-10-30 ENCOUNTER — Ambulatory Visit (HOSPITAL_BASED_OUTPATIENT_CLINIC_OR_DEPARTMENT_OTHER): Payer: 59 | Admitting: Certified Registered"

## 2018-10-30 ENCOUNTER — Ambulatory Visit (HOSPITAL_BASED_OUTPATIENT_CLINIC_OR_DEPARTMENT_OTHER)
Admission: RE | Admit: 2018-10-30 | Discharge: 2018-10-30 | Disposition: A | Discharge status: Home / Self Care | Payer: 59 | Attending: Plastic Surgery | Admitting: Plastic Surgery

## 2018-10-30 ENCOUNTER — Other Ambulatory Visit: Payer: Self-pay

## 2018-10-30 ENCOUNTER — Other Ambulatory Visit: Payer: Self-pay | Admitting: Plastic Surgery

## 2018-10-30 DIAGNOSIS — N6489 Other specified disorders of breast: Secondary | ICD-10-CM | POA: Insufficient documentation

## 2018-10-30 DIAGNOSIS — N641 Fat necrosis of breast: Secondary | ICD-10-CM | POA: Insufficient documentation

## 2018-10-30 DIAGNOSIS — Z975 Presence of (intrauterine) contraceptive device: Secondary | ICD-10-CM | POA: Insufficient documentation

## 2018-10-30 DIAGNOSIS — Z9889 Other specified postprocedural states: Secondary | ICD-10-CM | POA: Diagnosis not present

## 2018-10-30 HISTORY — PX: MASS EXCISION: SHX2000

## 2018-10-30 SURGERY — EXCISION MASS
Anesthesia: General | Site: Breast | Laterality: Left

## 2018-10-30 MED ORDER — FENTANYL CITRATE (PF) 100 MCG/2ML IJ SOLN
INTRAMUSCULAR | Status: AC
  Filled 2018-10-30: qty 2

## 2018-10-30 MED ORDER — ACETAMINOPHEN 325 MG PO TABS: 650.0000 mg | ORAL_TABLET | ORAL | Status: DC | PRN

## 2018-10-30 MED ORDER — DEXMEDETOMIDINE HCL IN NACL 200 MCG/50ML IV SOLN
INTRAVENOUS | Status: DC | PRN
  Administered 2018-10-30: 12 ug via INTRAVENOUS

## 2018-10-30 MED ORDER — HYDROMORPHONE HCL 1 MG/ML IJ SOLN
INTRAMUSCULAR | Status: AC
  Filled 2018-10-30: qty 0.5

## 2018-10-30 MED ORDER — DEXAMETHASONE SODIUM PHOSPHATE 10 MG/ML IJ SOLN
INTRAMUSCULAR | Status: DC | PRN
  Administered 2018-10-30: 10 mg via INTRAVENOUS

## 2018-10-30 MED ORDER — LIDOCAINE 2% (20 MG/ML) 5 ML SYRINGE
INTRAMUSCULAR | Status: AC
  Filled 2018-10-30: qty 5

## 2018-10-30 MED ORDER — OXYCODONE HCL 5 MG PO TABS: 5.0000 mg | ORAL_TABLET | Freq: Once | ORAL | Status: DC | PRN

## 2018-10-30 MED ORDER — FENTANYL CITRATE (PF) 100 MCG/2ML IJ SOLN
50.0000 ug | INTRAMUSCULAR | Status: AC | PRN
  Administered 2018-10-30 (×2): 25 ug via INTRAVENOUS
  Administered 2018-10-30: 100 ug via INTRAVENOUS
  Administered 2018-10-30 (×2): 25 ug via INTRAVENOUS

## 2018-10-30 MED ORDER — OXYCODONE HCL 5 MG PO TABS: 5.0000 mg | ORAL_TABLET | ORAL | Status: DC | PRN

## 2018-10-30 MED ORDER — CEFAZOLIN SODIUM-DEXTROSE 2-4 GM/100ML-% IV SOLN: 2.0000 g | INTRAVENOUS | Status: DC

## 2018-10-30 MED ORDER — LIDOCAINE-EPINEPHRINE 1 %-1:100000 IJ SOLN
INTRAMUSCULAR | Status: DC | PRN
  Administered 2018-10-30: 4 mL

## 2018-10-30 MED ORDER — SODIUM CHLORIDE 0.9% FLUSH: 3.0000 mL | INTRAVENOUS | Status: DC | PRN

## 2018-10-30 MED ORDER — MIDAZOLAM HCL 2 MG/2ML IJ SOLN
INTRAMUSCULAR | Status: AC
Start: 2018-10-30 — End: ?
  Filled 2018-10-30: qty 2

## 2018-10-30 MED ORDER — LACTATED RINGERS IV SOLN
INTRAVENOUS | Status: DC
  Administered 2018-10-30 (×2): via INTRAVENOUS

## 2018-10-30 MED ORDER — ONDANSETRON HCL 4 MG/2ML IJ SOLN
INTRAMUSCULAR | Status: DC | PRN
  Administered 2018-10-30: 4 mg via INTRAVENOUS

## 2018-10-30 MED ORDER — ONDANSETRON HCL 4 MG/2ML IJ SOLN
INTRAMUSCULAR | Status: AC
Start: 2018-10-30 — End: ?
  Filled 2018-10-30: qty 2

## 2018-10-30 MED ORDER — LIDOCAINE HCL (CARDIAC) PF 100 MG/5ML IV SOSY
PREFILLED_SYRINGE | INTRAVENOUS | Status: DC | PRN
  Administered 2018-10-30: 80 mg via INTRAVENOUS

## 2018-10-30 MED ORDER — PROMETHAZINE HCL 25 MG/ML IJ SOLN: 6.2500 mg | INTRAMUSCULAR | Status: DC | PRN

## 2018-10-30 MED ORDER — PHENYLEPHRINE HCL 10 MG/ML IJ SOLN
INTRAMUSCULAR | Status: DC | PRN
  Administered 2018-10-30: 120 ug via INTRAVENOUS
  Administered 2018-10-30: 80 ug via INTRAVENOUS

## 2018-10-30 MED ORDER — SCOPOLAMINE 1 MG/3DAYS TD PT72: 1.0000 | MEDICATED_PATCH | Freq: Once | TRANSDERMAL | Status: DC | PRN

## 2018-10-30 MED ORDER — ROCURONIUM BROMIDE 50 MG/5ML IV SOSY
PREFILLED_SYRINGE | INTRAVENOUS | Status: AC
  Filled 2018-10-30: qty 5

## 2018-10-30 MED ORDER — DEXAMETHASONE SODIUM PHOSPHATE 10 MG/ML IJ SOLN
INTRAMUSCULAR | Status: AC
  Filled 2018-10-30: qty 1

## 2018-10-30 MED ORDER — KETOROLAC TROMETHAMINE 10 MG PO TABS: 10.0000 mg | ORAL_TABLET | Freq: Three times a day (TID) | ORAL | 0 refills | Status: AC | PRN

## 2018-10-30 MED ORDER — MEPERIDINE HCL 25 MG/ML IJ SOLN: 6.2500 mg | INTRAMUSCULAR | Status: DC | PRN

## 2018-10-30 MED ORDER — OXYCODONE HCL 5 MG/5ML PO SOLN: 5.0000 mg | Freq: Once | ORAL | Status: DC | PRN

## 2018-10-30 MED ORDER — PROPOFOL 10 MG/ML IV BOLUS
INTRAVENOUS | Status: AC
  Filled 2018-10-30: qty 40

## 2018-10-30 MED ORDER — SODIUM CHLORIDE 0.9 % IV SOLN: 250.0000 mL | INTRAVENOUS | Status: DC | PRN

## 2018-10-30 MED ORDER — CEFAZOLIN SODIUM-DEXTROSE 2-3 GM-%(50ML) IV SOLR
INTRAVENOUS | Status: DC | PRN
  Administered 2018-10-30: 2 g via INTRAVENOUS

## 2018-10-30 MED ORDER — MIDAZOLAM HCL 2 MG/2ML IJ SOLN
1.0000 mg | INTRAMUSCULAR | Status: DC | PRN
  Administered 2018-10-30: 2 mg via INTRAVENOUS

## 2018-10-30 MED ORDER — HYDROMORPHONE HCL 1 MG/ML IJ SOLN
0.2500 mg | INTRAMUSCULAR | Status: DC | PRN
  Administered 2018-10-30: 0.5 mg via INTRAVENOUS

## 2018-10-30 MED ORDER — SODIUM CHLORIDE 0.9% FLUSH: 3.0000 mL | Freq: Two times a day (BID) | INTRAVENOUS | Status: DC

## 2018-10-30 MED ORDER — ACETAMINOPHEN 650 MG RE SUPP: 650.0000 mg | RECTAL | Status: DC | PRN

## 2018-10-30 MED FILL — KETOROLAC 10 MG TABLET: 10 | 2 days supply | Qty: 6 | Fill #0

## 2018-10-30 SURGICAL SUPPLY — 81 items
ADH SKN CLS APL DERMABOND .7 (GAUZE/BANDAGES/DRESSINGS) ×1
APL SKNCLS STERI-STRIP NONHPOA (GAUZE/BANDAGES/DRESSINGS)
BENZOIN TINCTURE PRP APPL 2/3 (GAUZE/BANDAGES/DRESSINGS) IMPLANT
BINDER BREAST XXLRG (GAUZE/BANDAGES/DRESSINGS) ×2 IMPLANT
BLADE CLIPPER SURG (BLADE) IMPLANT
BLADE SURG 15 STRL LF DISP TIS (BLADE) ×1 IMPLANT
BLADE SURG 15 STRL SS (BLADE) ×3
BNDG CONFORM 2 STRL LF (GAUZE/BANDAGES/DRESSINGS) IMPLANT
BNDG ELASTIC 2X5.8 VLCR STR LF (GAUZE/BANDAGES/DRESSINGS) IMPLANT
CANISTER SUCT 1200ML W/VALVE (MISCELLANEOUS) ×2 IMPLANT
CHLORAPREP W/TINT 26ML (MISCELLANEOUS) ×2 IMPLANT
CLEANER CAUTERY TIP 5X5 PAD (MISCELLANEOUS) IMPLANT
CLOSURE WOUND 1/2 X4 (GAUZE/BANDAGES/DRESSINGS)
CORD BIPOLAR FORCEPS 12FT (ELECTRODE) IMPLANT
COVER BACK TABLE 60X90IN (DRAPES) ×3 IMPLANT
COVER MAYO STAND STRL (DRAPES) ×3 IMPLANT
COVER WAND RF STERILE (DRAPES) IMPLANT
DECANTER SPIKE VIAL GLASS SM (MISCELLANEOUS) IMPLANT
DERMABOND ADVANCED (GAUZE/BANDAGES/DRESSINGS) ×2
DERMABOND ADVANCED .7 DNX12 (GAUZE/BANDAGES/DRESSINGS) IMPLANT
DRAPE LAPAROTOMY 100X72 PEDS (DRAPES) IMPLANT
DRAPE U-SHAPE 76X120 STRL (DRAPES) ×3 IMPLANT
DRSG PAD ABDOMINAL 8X10 ST (GAUZE/BANDAGES/DRESSINGS) ×2 IMPLANT
DRSG TEGADERM 2-3/8X2-3/4 SM (GAUZE/BANDAGES/DRESSINGS) IMPLANT
DRSG TEGADERM 4X4.75 (GAUZE/BANDAGES/DRESSINGS) IMPLANT
ELECT COATED BLADE 2.86 ST (ELECTRODE) ×2 IMPLANT
ELECT NDL BLADE 2-5/6 (NEEDLE) IMPLANT
ELECT NEEDLE BLADE 2-5/6 (NEEDLE) IMPLANT
ELECT REM PT RETURN 9FT ADLT (ELECTROSURGICAL) ×3
ELECT REM PT RETURN 9FT PED (ELECTROSURGICAL)
ELECTRODE REM PT RETRN 9FT PED (ELECTROSURGICAL) IMPLANT
ELECTRODE REM PT RTRN 9FT ADLT (ELECTROSURGICAL) IMPLANT
GAUZE SPONGE 4X4 12PLY STRL LF (GAUZE/BANDAGES/DRESSINGS) IMPLANT
GLOVE BIO SURGEON STRL SZ 6.5 (GLOVE) ×5 IMPLANT
GLOVE BIO SURGEON STRL SZ7 (GLOVE) ×2 IMPLANT
GLOVE BIO SURGEONS STRL SZ 6.5 (GLOVE) ×3
GLOVE BIOGEL PI IND STRL 7.5 (GLOVE) IMPLANT
GLOVE BIOGEL PI INDICATOR 7.5 (GLOVE) ×2
GLOVE EXAM NITRILE MD LF STRL (GLOVE) ×2 IMPLANT
GOWN STRL REUS W/ TWL LRG LVL3 (GOWN DISPOSABLE) ×2 IMPLANT
GOWN STRL REUS W/ TWL XL LVL3 (GOWN DISPOSABLE) IMPLANT
GOWN STRL REUS W/TWL LRG LVL3 (GOWN DISPOSABLE) ×6
GOWN STRL REUS W/TWL XL LVL3 (GOWN DISPOSABLE) ×3
NDL HYPO 25X1 1.5 SAFETY (NEEDLE) IMPLANT
NDL HYPO 30GX1 BEV (NEEDLE) IMPLANT
NDL PRECISIONGLIDE 27X1.5 (NEEDLE) ×1 IMPLANT
NEEDLE HYPO 25X1 1.5 SAFETY (NEEDLE) ×3 IMPLANT
NEEDLE HYPO 30GX1 BEV (NEEDLE) IMPLANT
NEEDLE PRECISIONGLIDE 27X1.5 (NEEDLE) IMPLANT
NS IRRIG 1000ML POUR BTL (IV SOLUTION) ×2 IMPLANT
PACK BASIN DAY SURGERY FS (CUSTOM PROCEDURE TRAY) ×3 IMPLANT
PAD CLEANER CAUTERY TIP 5X5 (MISCELLANEOUS)
PENCIL BUTTON HOLSTER BLD 10FT (ELECTRODE) ×2 IMPLANT
RUBBERBAND STERILE (MISCELLANEOUS) IMPLANT
SHEET MEDIUM DRAPE 40X70 STRL (DRAPES) IMPLANT
SLEEVE SCD COMPRESS KNEE MED (MISCELLANEOUS) ×2 IMPLANT
SPONGE GAUZE 2X2 8PLY STER LF (GAUZE/BANDAGES/DRESSINGS)
SPONGE GAUZE 2X2 8PLY STRL LF (GAUZE/BANDAGES/DRESSINGS) IMPLANT
STRIP CLOSURE SKIN 1/2X4 (GAUZE/BANDAGES/DRESSINGS) IMPLANT
SUCTION FRAZIER HANDLE 10FR (MISCELLANEOUS)
SUCTION TUBE FRAZIER 10FR DISP (MISCELLANEOUS) IMPLANT
SUT MNCRL 6-0 UNDY P1 1X18 (SUTURE) IMPLANT
SUT MNCRL AB 3-0 PS2 18 (SUTURE) IMPLANT
SUT MNCRL AB 4-0 PS2 18 (SUTURE) ×2 IMPLANT
SUT MON AB 5-0 P3 18 (SUTURE) ×2 IMPLANT
SUT MON AB 5-0 PS2 18 (SUTURE) ×2 IMPLANT
SUT MONOCRYL 6-0 P1 1X18 (SUTURE)
SUT PROLENE 5 0 P 3 (SUTURE) IMPLANT
SUT PROLENE 5 0 PS 2 (SUTURE) IMPLANT
SUT PROLENE 6 0 P 1 18 (SUTURE) IMPLANT
SUT VIC AB 5-0 P-3 18X BRD (SUTURE) IMPLANT
SUT VIC AB 5-0 P3 18 (SUTURE)
SUT VIC AB 5-0 PS2 18 (SUTURE) IMPLANT
SUT VICRYL 4-0 PS2 18IN ABS (SUTURE) IMPLANT
SYR BULB 3OZ (MISCELLANEOUS) ×2 IMPLANT
SYR CONTROL 10ML LL (SYRINGE) ×3 IMPLANT
TOWEL GREEN STERILE FF (TOWEL DISPOSABLE) ×5 IMPLANT
TRAY DSU PREP LF (CUSTOM PROCEDURE TRAY) ×1 IMPLANT
TUBE CONNECTING 20'X1/4 (TUBING) ×1
TUBE CONNECTING 20X1/4 (TUBING) ×1 IMPLANT
YANKAUER SUCT BULB TIP NO VENT (SUCTIONS) ×2 IMPLANT

## 2018-10-30 NOTE — Anesthesia Postprocedure Evaluation (Signed)
Anesthesia Post Note  Patient: Tracy Yoder  Procedure(s) Performed: EXCISION LEFT BREAST FAT NECROSIS (Left Breast)     Patient location during evaluation: PACU Anesthesia Type: General Level of consciousness: awake and alert Pain management: pain level controlled Vital Signs Assessment: post-procedure vital signs reviewed and stable Respiratory status: spontaneous breathing, nonlabored ventilation and respiratory function stable Cardiovascular status: blood pressure returned to baseline and stable Postop Assessment: no apparent nausea or vomiting Anesthetic complications: no    Last Vitals:  Vitals:   10/30/18 0930 10/30/18 0945  BP: 125/71 116/73  Pulse: 95 94  Resp: 16 18  Temp:  36.7 C  SpO2: 100% 100%    Last Pain:  Vitals:   10/30/18 0945  TempSrc:   PainSc: 3                  Lowella CurbWarren Ray Tarshia Kot

## 2018-10-30 NOTE — Op Note (Signed)
DATE OF OPERATION: 10/30/2018  [facility]  PREOPERATIVE DIAGNOSIS: left breast fat necrosis  POSTOPERATIVE DIAGNOSIS: Same  PROCEDURE: Excision of left breast fat necrosis 1. Superior 12 o'clock position: 2 x 4 cm 2. Lateral 2 x 2 cm  SURGEON: Hargun Spurling [place] Sunni Richardson, DO  ASSISTANT: Carmen Mayo, PA  EBL: 10 cc  CONDITION: Stable  COMPLICATIONS: None  INDICATION: The patient, Tracy Yoder, is a 22 y.o. female born on [birth year], is here for treatment of left breast fat necrosis after a bilateral breast reduction.   PROCEDURE DETAILS:  The patient was seen prior to surgery and marked.  The IV antibiotics were given. The patient was taken to the operating room and given a general anesthetic. A standard time out was performed and all information was confirmed by those in the room. SCDs were placed.   The left breast was prepped and draped.  Local with epinephrine was injected at the areola breast juncture.  After waiting for the local to take effect the #15 blade was used to make an incision from the 12 o'clock position to the 4 o'clock position at the site of the previous incision at the areola.  The bovie was used to dissect to the 2 x 4 cm area of fat necrosis superiorly and excise it.  The lateral lesion was then identified with a combination of the bovie and scissors.  The 2 x 2 area of fat necrosis was excised. Hemostasis was achieved with electrocautery.  The pickle forked canula was used to help break up the fat collection.  More was not removed with concern of NAC undermining. The deep layers were closed with the 4-0 Monocryl followed by the 5-0 subcuticular Monocryl. Derma bond was placed over the incision.  A sterile dressing was applied and the patient was placed in a breast binder. The patient was allowed to wake up and taken to recovery room in stable condition at the end of the case. The family was notified at the end of the case.

## 2018-10-30 NOTE — Anesthesia Preprocedure Evaluation (Signed)
Anesthesia Evaluation  Patient identified by MRN, date of birth, ID band Patient awake    Reviewed: Allergy & Precautions, NPO status , Patient's Chart, lab work & pertinent test results  History of Anesthesia Complications Negative for: history of anesthetic complications  Airway Mallampati: II  TM Distance: >3 FB Neck ROM: Full    Dental no notable dental hx. (+) Dental Advisory Given   Pulmonary asthma ,    Pulmonary exam normal breath sounds clear to auscultation       Cardiovascular negative cardio ROS Normal cardiovascular exam Rhythm:Regular Rate:Normal     Neuro/Psych negative neurological ROS     GI/Hepatic Neg liver ROS, GERD  ,  Endo/Other  negative endocrine ROS  Renal/GU negative Renal ROS     Musculoskeletal negative musculoskeletal ROS (+)   Abdominal (+) + obese,   Peds  Hematology negative hematology ROS (+)   Anesthesia Other Findings Day of surgery medications reviewed with the patient.  Reproductive/Obstetrics                             Anesthesia Physical  Anesthesia Plan  ASA: II  Anesthesia Plan: General   Post-op Pain Management:    Induction: Intravenous  PONV Risk Score and Plan: 3 and Ondansetron, Dexamethasone and Midazolam  Airway Management Planned: LMA  Additional Equipment:   Intra-op Plan:   Post-operative Plan: Extubation in OR  Informed Consent: I have reviewed the patients History and Physical, chart, labs and discussed the procedure including the risks, benefits and alternatives for the proposed anesthesia with the patient or authorized representative who has indicated his/her understanding and acceptance.   Dental advisory given  Plan Discussed with: Anesthesiologist, CRNA and Surgeon  Anesthesia Plan Comments:         Anesthesia Quick Evaluation

## 2018-10-30 NOTE — Transfer of Care (Signed)
Immediate Anesthesia Transfer of Care Note  Patient: Tracy Yoder  Procedure(s) Performed: EXCISION LEFT BREAST FAT NECROSIS (Left Breast)  Patient Location: PACU  Anesthesia Type:General  Level of Consciousness: awake, alert  and oriented  Airway & Oxygen Therapy: Patient Spontanous Breathing and Patient connected to face mask oxygen  Post-op Assessment: Report given to RN and Post -op Vital signs reviewed and stable  Post vital signs: Reviewed and stable  Last Vitals:  Vitals Value Taken Time  BP 94/55 10/30/2018  9:01 AM  Temp    Pulse 78 10/30/2018  9:02 AM  Resp 7 10/30/2018  9:02 AM  SpO2 100 % 10/30/2018  9:02 AM  Vitals shown include unvalidated device data.  Last Pain:  Vitals:   10/30/18 0710  TempSrc: Oral  PainSc: 0-No pain         Complications: No apparent anesthesia complications

## 2018-10-30 NOTE — Anesthesia Procedure Notes (Signed)
Procedure Name: LMA Insertion Performed by: Leray Garverick M, CRNA Pre-anesthesia Checklist: Patient identified, Emergency Drugs available, Suction available, Patient being monitored and Timeout performed Patient Re-evaluated:Patient Re-evaluated prior to induction Oxygen Delivery Method: Circle system utilized Preoxygenation: Pre-oxygenation with 100% oxygen Induction Type: IV induction LMA: LMA inserted LMA Size: 4.0 Tube type: Oral Number of attempts: 1 Placement Confirmation: positive ETCO2,  CO2 detector and breath sounds checked- equal and bilateral Tube secured with: Tape Dental Injury: Teeth and Oropharynx as per pre-operative assessment        

## 2018-10-30 NOTE — Discharge Instructions (Signed)
°INSTRUCTIONS FOR AFTER SURGERY ° ° °You are having surgery.  You will likely have some questions about what to expect following your operation.  The following information will help you and your family understand what to expect when you are discharged from the hospital.  Following these guidelines will help ensure a smooth recovery and reduce risks of complications.   °Postoperative instructions include information on: diet, wound care, medications and physical activity. ° °AFTER SURGERY °Expect to go home after the procedure.  In some cases, you may need to spend one night in the hospital for observation. ° °DIET °This surgery does not require a specific diet.  However, I have to mention that the healthier you eat the better your body can start healing. It is important to increasing your protein intake.  This means limiting the foods with sugar and carbohydrates.  Focus on vegetables and some meat.  If you have any liposuction during your procedure be sure to drink water.  If your urine is bright yellow, then it is concentrated, and you need to drink more water.  As a general rule after surgery, you should have 8 ounces of water every hour while awake.  If you find you are persistently nauseated or unable to take in liquids let us know.  NO TOBACCO USE or EXPOSURE.  This will slow your healing process and increase the risk of a wound. ° °WOUND CARE °You can shower the day after surgery if you don't have a drain.  Use fragrance free soap.  Dial, Dove and Ivory are usually mild on the skin. If you have a drain clean with baby wipes until the drain is removed.  If you have steri-strips / tape directly attached to your skin leave them in place. It is OK to get these wet.  No baths, pools or hot tubs for two weeks. °We close your incision to leave the smallest and best-looking scar. No ointment or creams on your incisions until given the go ahead.  Especially not Neosporin (Too many skin reactions with this one).  A  few weeks after surgery you can use Mederma and start massaging the scar. °We ask you to wear your binder or sports bra for the first 6 weeks around the clock, including while sleeping. This provides added comfort and helps reduce the fluid accumulation at the surgery site. ° °ACTIVITY °No heavy lifting until cleared by the doctor.  It is OK to walk and climb stairs. In fact, moving your legs is very important to decrease your risk of a blood clot.  It will also help keep you from getting deconditioned.  Every 1 to 2 hours get up and walk for 5 minutes. This will help with a quicker recovery back to normal.  Let pain be your guide so you don't do too much.  NO, you cannot do the spring cleaning and don't plan on taking care of anyone else.  This is your time for TLC.  °You will be more comfortable if you sleep and rest with your head elevated either with a few pillows under you or in a recliner.  No stomach sleeping for a few months. ° °WORK °Everyone returns to work at different times. As a rough guide, most people take at least 1 - 2 weeks off prior to returning to work. If you need documentation for your job, bring the forms to your postoperative follow up visit. ° °DRIVING °Arrange for someone to bring you home from the hospital.  You   may be able to drive a few days after surgery but not while taking any narcotics or valium. ° °BOWEL MOVEMENTS °Constipation can occur after anesthesia and while taking pain medication.  It is important to stay ahead for your comfort.  We recommend taking Milk of Magnesia (2 tablespoons; twice a day) while taking the pain pills. ° °SEROMA °This is fluid your body tried to put in the surgical site.  This is normal but if it creates tight skinny skin let us know.  It usually decreases in a few weeks. ° °WHEN TO CALL °Call your surgeon's office if any of the following occur: °• Fever 101 degrees F or greater °• Excessive bleeding or fluid from the incision site. °• Pain that increases  over time without aid from the medications °• Redness, warmth, or pus draining from incision sites °• Persistent nausea or inability to take in liquids °• Severe misshapen area that underwent the operation. ° °] ° ° ° ° ° °Post Anesthesia Home Care Instructions ° °Activity: °Get plenty of rest for the remainder of the day. A responsible individual must stay with you for 24 hours following the procedure.  °For the next 24 hours, DO NOT: °-Drive a car °-Operate machinery °-Drink alcoholic beverages °-Take any medication unless instructed by your physician °-Make any legal decisions or sign important papers. ° °Meals: °Start with liquid foods such as gelatin or soup. Progress to regular foods as tolerated. Avoid greasy, spicy, heavy foods. If nausea and/or vomiting occur, drink only clear liquids until the nausea and/or vomiting subsides. Call your physician if vomiting continues. ° °Special Instructions/Symptoms: °Your throat may feel dry or sore from the anesthesia or the breathing tube placed in your throat during surgery. If this causes discomfort, gargle with warm salt water. The discomfort should disappear within 24 hours. ° °If you had a scopolamine patch placed behind your ear for the management of post- operative nausea and/or vomiting: ° °1. The medication in the patch is effective for 72 hours, after which it should be removed.  Wrap patch in a tissue and discard in the trash. Wash hands thoroughly with soap and water. °2. You may remove the patch earlier than 72 hours if you experience unpleasant side effects which may include dry mouth, dizziness or visual disturbances. °3. Avoid touching the patch. Wash your hands with soap and water after contact with the patch. °   ° °

## 2018-10-30 NOTE — H&P (Signed)
Tracy Yoder is an 22 y.o. female.   Chief Complaint: fat necrosis of left breast HPI:  The patient is a 22 yrs old wf here for treatment of her left breast necrosis.  The patient underwent a bilateral breast reduction several months ago.  She developed fat necrosis.  The right side resolved but the left did not completely resolve.  After multiple discussion the plan was for resection.   Past Medical History:  Diagnosis Date  . Asthma   . GERD (gastroesophageal reflux disease)     Past Surgical History:  Procedure Laterality Date  . ADENOIDECTOMY    . APPENDECTOMY    . BREAST REDUCTION SURGERY Bilateral 03/06/2018   Procedure: BILATERAL MAMMARY REDUCTION  (BREAST);  Surgeon: Peggye Formillingham, Claire S, DO;  Location: [facility];  Service: Plastics;  Laterality: Bilateral;  . TONSILLECTOMY AND ADENOIDECTOMY      History reviewed. No pertinent family history. Social History:  reports that she has never smoked. She has never used smokeless tobacco. She reports current alcohol use. She reports that she does not use drugs.  Allergies: No Known Allergies  Medications Prior to Admission  Medication Sig Dispense Refill  . levonorgestrel (MIRENA) 20 MCG/24HR IUD 1 each by Intrauterine route once.      No results found for this or any previous visit (from the past 48 hour(s)). No results found.  Review of Systems  Constitutional: Negative.   HENT: Negative.   Eyes: Negative.   Respiratory: Negative.   Cardiovascular: Negative.   Gastrointestinal: Negative.   Genitourinary: Negative.   Musculoskeletal: Negative.   Skin: Negative.   Neurological: Negative.   Psychiatric/Behavioral: Negative.     Blood pressure 125/77, pulse 80, temperature 98.9 F (37.2 C), temperature source Oral, resp. rate 16, height 5\' 1"  (1.549 m), weight 87.2 kg, last menstrual period 10/13/2018. Physical Exam  Constitutional: She is oriented to person, place, and time. She appears  well-developed and well-nourished.  HENT:  Head: Normocephalic and atraumatic.  Eyes: Pupils are equal, round, and reactive to light. EOM are normal.  Cardiovascular: Normal rate.  Respiratory: Effort normal.  GI: Soft. She exhibits distension.  Neurological: She is alert and oriented to person, place, and time.  Skin: Skin is warm. No rash noted. No erythema. No pallor.  Psychiatric: She has a normal mood and affect. Her behavior is normal. Judgment and thought content normal.     Assessment/Plan Plan for resection of fat necrosis of left breast.  Alena Billslaire S Dillingham, DO 10/30/2018, 7:35 AM

## 2018-10-31 ENCOUNTER — Encounter (HOSPITAL_BASED_OUTPATIENT_CLINIC_OR_DEPARTMENT_OTHER): Payer: Self-pay | Admitting: Plastic Surgery

## 2018-11-11 ENCOUNTER — Encounter: Payer: Self-pay | Admitting: Plastic Surgery

## 2018-11-11 ENCOUNTER — Ambulatory Visit (INDEPENDENT_AMBULATORY_CARE_PROVIDER_SITE_OTHER): Payer: 59 | Admitting: Plastic Surgery

## 2018-11-11 VITALS — BP 120/76 | HR 93 | Temp 99.1°F | Ht 61.0 in | Wt 193.0 lb

## 2018-11-11 DIAGNOSIS — N641 Fat necrosis of breast: Secondary | ICD-10-CM

## 2018-11-11 DIAGNOSIS — Z9889 Other specified postprocedural states: Secondary | ICD-10-CM

## 2018-11-11 NOTE — Progress Notes (Signed)
   Subjective:    Patient ID: Tracy Yoder, female    DOB: [birth year], 23 y.o.   MRN: [id number]  [name] is a 23 year old white female here with her mom for follow-up after left breast surgery.  She underwent a breast reduction several months ago and had residual fat necrosis on the left breast.  We went to the OR for excision and treatment of the scar tissue.  She has done extremely well after surgery.  She denies any pain.  There is no sign of infection.  There is no sign of seroma or hematoma.  She is pleased with the size.  The incision is healing well.   Review of Systems  Constitutional: Negative.   HENT: Negative.   Eyes: Negative.   Respiratory: Negative.   Gastrointestinal: Negative.   Endocrine: Negative.   Genitourinary: Negative.   Musculoskeletal: Negative.   Skin: Negative.        Objective:   Physical Exam Vitals signs and nursing note reviewed.  Constitutional:      Appearance: Normal appearance.  HENT:     Head: Normocephalic.  Cardiovascular:     Rate and Rhythm: Normal rate.  Pulmonary:     Effort: Pulmonary effort is normal. No respiratory distress.  Neurological:     Mental Status: She is alert.  Psychiatric:        Mood and Affect: Mood normal.        Thought Content: Thought content normal.        Judgment: Judgment normal.       Assessment & Plan:  Breast, fat necrosis  History of bilateral breast reduction surgery Continue with massage to the firm areas.  Continue with the sports bra for 6 weeks.  Can resume physical activity.  Call with any concerns but otherwise cleared.  We had a long discussion about postop care and future mammograms and need for biopsy.

## 2019-06-30 DIAGNOSIS — H5213 Myopia, bilateral: Secondary | ICD-10-CM | POA: Diagnosis not present

## 2019-07-08 DIAGNOSIS — R309 Painful micturition, unspecified: Secondary | ICD-10-CM | POA: Diagnosis not present

## 2019-07-08 DIAGNOSIS — Z30431 Encounter for routine checking of intrauterine contraceptive device: Secondary | ICD-10-CM | POA: Diagnosis not present

## 2019-07-08 DIAGNOSIS — Z6835 Body mass index (BMI) 35.0-35.9, adult: Secondary | ICD-10-CM | POA: Diagnosis not present

## 2019-07-08 DIAGNOSIS — Z0000 Encounter for general adult medical examination without abnormal findings: Secondary | ICD-10-CM | POA: Diagnosis not present

## 2019-07-08 DIAGNOSIS — N760 Acute vaginitis: Secondary | ICD-10-CM | POA: Diagnosis not present

## 2019-07-08 DIAGNOSIS — Z01419 Encounter for gynecological examination (general) (routine) without abnormal findings: Secondary | ICD-10-CM | POA: Diagnosis not present

## 2019-07-08 DIAGNOSIS — J4520 Mild intermittent asthma, uncomplicated: Secondary | ICD-10-CM | POA: Diagnosis not present

## 2019-07-08 DIAGNOSIS — N898 Other specified noninflammatory disorders of vagina: Secondary | ICD-10-CM | POA: Diagnosis not present

## 2019-07-09 MED FILL — metroNIDAZOLE 500 MG TABS: 500 | 7 days supply | Qty: 14 | Fill #0

## 2019-07-15 MED FILL — AMOX-CLAV 875-125 MG TABLET: 875-125 | 5 days supply | Qty: 10 | Fill #0

## 2019-07-21 DIAGNOSIS — Z13220 Encounter for screening for lipoid disorders: Secondary | ICD-10-CM | POA: Diagnosis not present

## 2019-07-21 DIAGNOSIS — Z23 Encounter for immunization: Secondary | ICD-10-CM | POA: Diagnosis not present

## 2019-07-21 DIAGNOSIS — Z0000 Encounter for general adult medical examination without abnormal findings: Secondary | ICD-10-CM | POA: Diagnosis not present

## 2019-09-03 DIAGNOSIS — L732 Hidradenitis suppurativa: Secondary | ICD-10-CM | POA: Diagnosis not present

## 2019-09-03 MED FILL — DOXYCYCLINE HYCLATE 100 MG: 100 | 10 days supply | Qty: 20 | Fill #0

## 2019-12-17 DIAGNOSIS — T8332XA Displacement of intrauterine contraceptive device, initial encounter: Secondary | ICD-10-CM | POA: Diagnosis not present

## 2019-12-17 DIAGNOSIS — R110 Nausea: Secondary | ICD-10-CM | POA: Diagnosis not present

## 2019-12-17 DIAGNOSIS — N83201 Unspecified ovarian cyst, right side: Secondary | ICD-10-CM | POA: Diagnosis not present

## 2019-12-17 DIAGNOSIS — J45909 Unspecified asthma, uncomplicated: Secondary | ICD-10-CM | POA: Diagnosis not present

## 2019-12-17 DIAGNOSIS — R102 Pelvic and perineal pain: Secondary | ICD-10-CM | POA: Diagnosis not present

## 2019-12-18 DIAGNOSIS — J45909 Unspecified asthma, uncomplicated: Secondary | ICD-10-CM | POA: Diagnosis not present

## 2019-12-18 DIAGNOSIS — T8332XA Displacement of intrauterine contraceptive device, initial encounter: Secondary | ICD-10-CM | POA: Diagnosis not present

## 2019-12-18 DIAGNOSIS — N83201 Unspecified ovarian cyst, right side: Secondary | ICD-10-CM | POA: Diagnosis not present

## 2019-12-25 DIAGNOSIS — R102 Pelvic and perineal pain: Secondary | ICD-10-CM | POA: Diagnosis not present

## 2020-01-05 DIAGNOSIS — W57XXXA Bitten or stung by nonvenomous insect and other nonvenomous arthropods, initial encounter: Secondary | ICD-10-CM | POA: Diagnosis not present

## 2020-01-05 DIAGNOSIS — R102 Pelvic and perineal pain: Secondary | ICD-10-CM | POA: Diagnosis not present

## 2020-01-05 DIAGNOSIS — T8332XA Displacement of intrauterine contraceptive device, initial encounter: Secondary | ICD-10-CM | POA: Diagnosis not present

## 2020-01-05 DIAGNOSIS — S0086XA Insect bite (nonvenomous) of other part of head, initial encounter: Secondary | ICD-10-CM | POA: Diagnosis not present

## 2020-10-17 DIAGNOSIS — Z23 Encounter for immunization: Secondary | ICD-10-CM | POA: Diagnosis not present

## 2020-10-20 ENCOUNTER — Other Ambulatory Visit (HOSPITAL_COMMUNITY): Payer: Self-pay | Admitting: Obstetrics and Gynecology

## 2020-10-20 DIAGNOSIS — F419 Anxiety disorder, unspecified: Secondary | ICD-10-CM | POA: Diagnosis not present

## 2020-10-20 DIAGNOSIS — N641 Fat necrosis of breast: Secondary | ICD-10-CM | POA: Diagnosis not present

## 2020-10-20 DIAGNOSIS — Z6831 Body mass index (BMI) 31.0-31.9, adult: Secondary | ICD-10-CM | POA: Diagnosis not present

## 2020-10-20 DIAGNOSIS — Z01419 Encounter for gynecological examination (general) (routine) without abnormal findings: Secondary | ICD-10-CM | POA: Diagnosis not present

## 2020-10-20 DIAGNOSIS — Z309 Encounter for contraceptive management, unspecified: Secondary | ICD-10-CM | POA: Diagnosis not present

## 2020-10-20 MED FILL — FEMYNOR 0.25-35 MG-MCG TABS: 0.25-35 | 84 days supply | Qty: 84 | Fill #0

## 2020-10-20 MED FILL — buPROPion HCL ER (XL) 150 M: 150 | 90 days supply | Qty: 90 | Fill #0

## 2020-12-30 ENCOUNTER — Ambulatory Visit: Payer: Self-pay

## 2020-12-30 ENCOUNTER — Encounter: Payer: Self-pay | Admitting: Emergency Medicine

## 2020-12-30 ENCOUNTER — Ambulatory Visit
Admission: EM | Admit: 2020-12-30 | Discharge: 2020-12-30 | Disposition: A | Discharge status: Home / Self Care | Payer: 59 | Attending: Family Medicine | Admitting: Family Medicine

## 2020-12-30 ENCOUNTER — Other Ambulatory Visit: Payer: Self-pay

## 2020-12-30 DIAGNOSIS — B349 Viral infection, unspecified: Secondary | ICD-10-CM | POA: Insufficient documentation

## 2020-12-30 DIAGNOSIS — Z20822 Contact with and (suspected) exposure to covid-19: Secondary | ICD-10-CM | POA: Insufficient documentation

## 2020-12-30 LAB — RAPID INFLUENZA A&B ANTIGENS
Influenza A (ARMC): NEGATIVE
Influenza B (ARMC): NEGATIVE

## 2020-12-30 MED ORDER — KETOROLAC TROMETHAMINE 10 MG PO TABS: 10.0000 mg | ORAL_TABLET | Freq: Four times a day (QID) | ORAL | 0 refills | Status: DC | PRN

## 2020-12-30 MED ORDER — ACETAMINOPHEN 500 MG PO TABS
1000.0000 mg | ORAL_TABLET | Freq: Once | ORAL | Status: AC
  Administered 2020-12-30: 1000 mg via ORAL

## 2020-12-30 NOTE — ED Triage Notes (Signed)
Patient c/o bodyaches, muscle pain and headache, and back pain that started last night.  Patient denies fevers.  Patient reports chills and a runny nose.

## 2020-12-30 NOTE — Discharge Instructions (Signed)
Rest.   Fluids.  Medication as needed for body aches.  Tylenol for fever.  Stay home.  COVID testing will be back in the AM.

## 2020-12-30 NOTE — ED Provider Notes (Signed)
[facility]    CSN: [id number] Arrival date & time: 12/30/20  1539      History   Chief Complaint Chief Complaint  Patient presents with  . Generalized Body Aches  . Headache   HPI  25 year old female presents with the above complaints.   Patient reports that her symptoms started last night.  She recently went on a trip for work.  She reports body aches, myalgias, chills.  Also has runny nose.  Mild cough.  No documented fever at home although she is febrile here at 100.5.  She has not taken any medication for her symptoms.  No reported sick contacts.  No other associated symptoms.  No other complaints.  Past Medical History:  Diagnosis Date  . Asthma   . GERD (gastroesophageal reflux disease)     Patient Active Problem List   Diagnosis Date Noted  . History of bilateral breast reduction surgery 09/05/2018  . Breast, fat necrosis 09/05/2018    Past Surgical History:  Procedure Laterality Date  . ADENOIDECTOMY    . APPENDECTOMY    . BREAST REDUCTION SURGERY Bilateral 03/06/2018   Procedure: BILATERAL MAMMARY REDUCTION  (BREAST);  Surgeon: Peggye Form, DO;  Location: [facility];  Service: Plastics;  Laterality: Bilateral;  . MASS EXCISION Left 10/30/2018   Procedure: EXCISION LEFT BREAST FAT NECROSIS;  Surgeon: Peggye Form, DO;  Location: [facility];  Service: Plastics;  Laterality: Left;  . TONSILLECTOMY AND ADENOIDECTOMY      OB History   No obstetric history on file.      Home Medications    Prior to Admission medications   Medication Sig Start Date End Date Taking? Authorizing Provider  buPROPion (WELLBUTRIN XL) 150 MG 24 hr tablet bupropion HCl XL 150 mg 24 hr tablet, extended release  TAKE 1 TABLET EVERY DAY BY MOUTH   Yes [provider]  Randa Ngo 0.25-35 MG-MCG tablet Take 1 tablet by mouth daily. 10/20/20  Yes [provider]  ketorolac (TORADOL) 10 MG tablet Take 1 tablet (10  mg total) by mouth every 6 (six) hours as needed for moderate pain or severe pain. 12/30/20  Yes Jadyn Brasher, Verdis Frederickson, DO  levonorgestrel (MIRENA) 20 MCG/24HR IUD 1 each by Intrauterine route once.  12/30/20  [provider]    Family History History reviewed. No pertinent family history.  Social History Social History   Tobacco Use  . Smoking status: Never Smoker  . Smokeless tobacco: Never Used  Vaping Use  . Vaping Use: Former  Substance Use Topics  . Alcohol use: Yes    Comment: occasional  . Drug use: Never     Allergies   Patient has no known allergies.   Review of Systems Review of Systems  Constitutional: Positive for chills.  HENT: Positive for rhinorrhea.   Musculoskeletal: Positive for arthralgias and myalgias.   Physical Exam Triage Vital Signs ED Triage Vitals  Enc Vitals Group     BP 12/30/20 1552 135/86     Pulse Rate 12/30/20 1552 (!) 108     Resp 12/30/20 1552 14     Temp 12/30/20 1552 (!) 100.5 F (38.1 C)     Temp Source 12/30/20 1552 Oral     SpO2 12/30/20 1552 100 %     Weight 12/30/20 1547 170 lb (77.1 kg)     Height 12/30/20 1547 5\' 2"  (1.575 m)     Head Circumference --      Peak Flow --  Pain Score 12/30/20 1547 8     Pain Loc --      Pain Edu? --      Excl. in GC? --    Updated Vital Signs BP 135/86 (BP Location: Right Arm)   Pulse (!) 108   Temp (!) 100.5 F (38.1 C) (Oral)   Resp 14   Ht 5\' 2"  (1.575 m)   Wt 77.1 kg   LMP 12/25/2020 (Exact Date)   SpO2 100%   BMI 31.09 kg/m   Visual Acuity Right Eye Distance:   Left Eye Distance:   Bilateral Distance:    Right Eye Near:   Left Eye Near:    Bilateral Near:     Physical Exam Vitals and nursing note reviewed.  Constitutional:      General: She is not in acute distress.    Appearance: She is not ill-appearing.  HENT:     Head: Normocephalic and atraumatic.  Eyes:     General:        Right eye: No discharge.        Left eye: No discharge.      Conjunctiva/sclera: Conjunctivae normal.  Cardiovascular:     Rate and Rhythm: Regular rhythm. Tachycardia present.  Pulmonary:     Effort: Pulmonary effort is normal.     Breath sounds: Normal breath sounds. No wheezing, rhonchi or rales.  Neurological:     Mental Status: She is alert.  Psychiatric:        Mood and Affect: Mood normal.        Behavior: Behavior normal.    UC Treatments / Results  Labs (all labs ordered are listed, but only abnormal results are displayed) Labs Reviewed  RAPID INFLUENZA A&B ANTIGENS  SARS CORONAVIRUS 2 (TAT 6-24 HRS)    EKG   Radiology No results found.  Procedures Procedures (including critical care time)  Medications Ordered in UC Medications  acetaminophen (TYLENOL) tablet 1,000 mg (1,000 mg Oral Given 12/30/20 1559)    Initial Impression / Assessment and Plan / UC Course  I have reviewed the triage vital signs and the nursing notes.  Pertinent labs & imaging results that were available during my care of the patient were reviewed by me and considered in my medical decision making (see chart for details).    25 year old female presents with a viral illness.  Possible COVID-19.  Flu testing negative.  Awaiting Covid testing results.  Toradol for body aches.  Supportive care.  Final Clinical Impressions(s) / UC Diagnoses   Final diagnoses:  Viral illness  Encounter for laboratory testing for COVID-19 virus     Discharge Instructions     Rest.   Fluids.  Medication as needed for body aches.  Tylenol for fever.  Stay home.  COVID testing will be back in the AM.    ED Prescriptions    Medication Sig Dispense Auth. Provider   ketorolac (TORADOL) 10 MG tablet Take 1 tablet (10 mg total) by mouth every 6 (six) hours as needed for moderate pain or severe pain. 20 tablet 30, DO     PDMP not reviewed this encounter.   Tommie Sams, Tommie Sams 12/30/20 [phone]

## 2020-12-31 LAB — SARS CORONAVIRUS 2 (TAT 6-24 HRS): SARS Coronavirus 2: NEGATIVE

## 2021-01-02 ENCOUNTER — Other Ambulatory Visit (HOSPITAL_COMMUNITY): Payer: Self-pay | Admitting: Obstetrics and Gynecology

## 2021-01-02 ENCOUNTER — Emergency Department (HOSPITAL_COMMUNITY): Payer: 59

## 2021-01-02 ENCOUNTER — Emergency Department (HOSPITAL_COMMUNITY)
Admission: EM | Admit: 2021-01-02 | Discharge: 2021-01-02 | Disposition: A | Discharge status: Home / Self Care | Payer: 59 | Source: Home / Self Care | Attending: Emergency Medicine | Admitting: Emergency Medicine

## 2021-01-02 ENCOUNTER — Encounter (HOSPITAL_COMMUNITY): Payer: Self-pay

## 2021-01-02 ENCOUNTER — Other Ambulatory Visit: Payer: Self-pay

## 2021-01-02 DIAGNOSIS — Z113 Encounter for screening for infections with a predominantly sexual mode of transmission: Secondary | ICD-10-CM | POA: Diagnosis not present

## 2021-01-02 DIAGNOSIS — K5900 Constipation, unspecified: Secondary | ICD-10-CM | POA: Diagnosis not present

## 2021-01-02 DIAGNOSIS — R945 Abnormal results of liver function studies: Secondary | ICD-10-CM | POA: Diagnosis not present

## 2021-01-02 DIAGNOSIS — R7401 Elevation of levels of liver transaminase levels: Secondary | ICD-10-CM | POA: Insufficient documentation

## 2021-01-02 DIAGNOSIS — K219 Gastro-esophageal reflux disease without esophagitis: Secondary | ICD-10-CM | POA: Insufficient documentation

## 2021-01-02 DIAGNOSIS — J45909 Unspecified asthma, uncomplicated: Secondary | ICD-10-CM | POA: Insufficient documentation

## 2021-01-02 DIAGNOSIS — R7989 Other specified abnormal findings of blood chemistry: Secondary | ICD-10-CM | POA: Diagnosis not present

## 2021-01-02 DIAGNOSIS — R112 Nausea with vomiting, unspecified: Secondary | ICD-10-CM | POA: Insufficient documentation

## 2021-01-02 DIAGNOSIS — Z20822 Contact with and (suspected) exposure to covid-19: Secondary | ICD-10-CM | POA: Diagnosis not present

## 2021-01-02 DIAGNOSIS — R1031 Right lower quadrant pain: Secondary | ICD-10-CM | POA: Diagnosis not present

## 2021-01-02 DIAGNOSIS — T398X5A Adverse effect of other nonopioid analgesics and antipyretics, not elsewhere classified, initial encounter: Secondary | ICD-10-CM | POA: Diagnosis not present

## 2021-01-02 DIAGNOSIS — B9789 Other viral agents as the cause of diseases classified elsewhere: Secondary | ICD-10-CM | POA: Diagnosis not present

## 2021-01-02 DIAGNOSIS — N921 Excessive and frequent menstruation with irregular cycle: Secondary | ICD-10-CM | POA: Diagnosis not present

## 2021-01-02 DIAGNOSIS — R1084 Generalized abdominal pain: Secondary | ICD-10-CM | POA: Insufficient documentation

## 2021-01-02 DIAGNOSIS — R109 Unspecified abdominal pain: Secondary | ICD-10-CM | POA: Diagnosis not present

## 2021-01-02 DIAGNOSIS — R823 Hemoglobinuria: Secondary | ICD-10-CM | POA: Diagnosis not present

## 2021-01-02 DIAGNOSIS — R748 Abnormal levels of other serum enzymes: Secondary | ICD-10-CM | POA: Diagnosis not present

## 2021-01-02 DIAGNOSIS — R0602 Shortness of breath: Secondary | ICD-10-CM | POA: Diagnosis not present

## 2021-01-02 DIAGNOSIS — M791 Myalgia, unspecified site: Secondary | ICD-10-CM | POA: Insufficient documentation

## 2021-01-02 DIAGNOSIS — R251 Tremor, unspecified: Secondary | ICD-10-CM | POA: Diagnosis not present

## 2021-01-02 DIAGNOSIS — Z79899 Other long term (current) drug therapy: Secondary | ICD-10-CM | POA: Diagnosis not present

## 2021-01-02 DIAGNOSIS — N760 Acute vaginitis: Secondary | ICD-10-CM | POA: Diagnosis not present

## 2021-01-02 DIAGNOSIS — Z20828 Contact with and (suspected) exposure to other viral communicable diseases: Secondary | ICD-10-CM | POA: Diagnosis not present

## 2021-01-02 LAB — CBC
HCT: 41.3 % (ref 36.0–46.0)
Hemoglobin: 14 g/dL (ref 12.0–15.0)
MCH: 29 pg (ref 26.0–34.0)
MCHC: 33.9 g/dL (ref 30.0–36.0)
MCV: 85.7 fL (ref 80.0–100.0)
Platelets: 189 10*3/uL (ref 150–400)
RBC: 4.82 MIL/uL (ref 3.87–5.11)
RDW: 13.5 % (ref 11.5–15.5)
WBC: 3.2 10*3/uL — ABNORMAL LOW (ref 4.0–10.5)
nRBC: 0 % (ref 0.0–0.2)

## 2021-01-02 LAB — COMPREHENSIVE METABOLIC PANEL
ALT: 179 U/L — ABNORMAL HIGH (ref 0–44)
AST: 200 U/L — ABNORMAL HIGH (ref 15–41)
Albumin: 3.9 g/dL (ref 3.5–5.0)
Alkaline Phosphatase: 140 U/L — ABNORMAL HIGH (ref 38–126)
Anion gap: 11 (ref 5–15)
BUN: 13 mg/dL (ref 6–20)
CO2: 24 mmol/L (ref 22–32)
Calcium: 9.2 mg/dL (ref 8.9–10.3)
Chloride: 103 mmol/L (ref 98–111)
Creatinine, Ser: 0.76 mg/dL (ref 0.44–1.00)
GFR, Estimated: >60 mL/min (ref >60)
Glucose, Bld: 92 mg/dL (ref 70–99)
Potassium: 3.5 mmol/L (ref 3.5–5.1)
Sodium: 138 mmol/L (ref 135–145)
Total Bilirubin: 3.3 mg/dL — ABNORMAL HIGH (ref 0.3–1.2)
Total Protein: 7.5 g/dL (ref 6.5–8.1)

## 2021-01-02 LAB — DIFFERENTIAL
Abs Immature Granulocytes: 0.01 10*3/uL (ref 0.00–0.07)
Band Neutrophils: 3 %
Basophils Absolute: 0 10*3/uL (ref 0.0–0.1)
Basophils Relative: 1 %
Eosinophils Absolute: 0 10*3/uL (ref 0.0–0.5)
Eosinophils Relative: 0 %
Lymphocytes Relative: 38 %
Lymphs Abs: 1.8 10*3/uL (ref 0.7–4.0)
Monocytes Absolute: 0.3 10*3/uL (ref 0.1–1.0)
Monocytes Relative: 4 %
Neutro Abs: 1.5 10*3/uL — ABNORMAL LOW (ref 1.7–7.7)
Neutrophils Relative %: 54 %

## 2021-01-02 LAB — LIPASE, BLOOD: Lipase: 45 U/L (ref 11–51)

## 2021-01-02 LAB — URINALYSIS, ROUTINE W REFLEX MICROSCOPIC
Glucose, UA: NEGATIVE mg/dL
Ketones, ur: 5 mg/dL — AB
Leukocytes,Ua: NEGATIVE
Nitrite: NEGATIVE
Protein, ur: 30 mg/dL — AB
Specific Gravity, Urine: 1.026 (ref 1.005–1.030)
pH: 5 (ref 5.0–8.0)

## 2021-01-02 LAB — ACETAMINOPHEN LEVEL: Acetaminophen (Tylenol), Serum: 12 ug/mL (ref 10–30)

## 2021-01-02 LAB — I-STAT BETA HCG BLOOD, ED (MC, WL, AP ONLY): I-stat hCG, quantitative: <5 m[IU]/mL (ref <5)

## 2021-01-02 MED ORDER — ONDANSETRON HCL 4 MG/2ML IJ SOLN
4.0000 mg | Freq: Once | INTRAMUSCULAR | Status: AC
  Administered 2021-01-02: 4 mg via INTRAVENOUS
  Filled 2021-01-02: qty 2

## 2021-01-02 MED ORDER — ONDANSETRON 4 MG PO TBDP: 4.0000 mg | ORAL_TABLET | Freq: Three times a day (TID) | ORAL | 0 refills | Status: AC | PRN

## 2021-01-02 MED ORDER — OMEPRAZOLE 20 MG PO CPDR: 20.0000 mg | DELAYED_RELEASE_CAPSULE | Freq: Two times a day (BID) | ORAL | 0 refills | Status: DC

## 2021-01-02 MED ORDER — MORPHINE SULFATE (PF) 4 MG/ML IV SOLN
4.0000 mg | Freq: Once | INTRAVENOUS | Status: AC
  Administered 2021-01-02: 4 mg via INTRAVENOUS
  Filled 2021-01-02: qty 1

## 2021-01-02 MED ORDER — IOHEXOL 300 MG/ML  SOLN
100.0000 mL | Freq: Once | INTRAMUSCULAR | Status: AC | PRN
  Administered 2021-01-02: 100 mL via INTRAVENOUS

## 2021-01-02 MED ORDER — HYDROMORPHONE HCL 1 MG/ML IJ SOLN
0.5000 mg | Freq: Once | INTRAMUSCULAR | Status: AC
  Administered 2021-01-02: 0.5 mg via INTRAVENOUS
  Filled 2021-01-02: qty 1

## 2021-01-02 MED ORDER — PANTOPRAZOLE SODIUM 40 MG PO TBEC
40.0000 mg | DELAYED_RELEASE_TABLET | Freq: Every day | ORAL | Status: DC
  Administered 2021-01-02: 40 mg via ORAL
  Filled 2021-01-02: qty 1

## 2021-01-02 MED ORDER — SODIUM CHLORIDE 0.9 % IV BOLUS
1000.0000 mL | Freq: Once | INTRAVENOUS | Status: AC
  Administered 2021-01-02: 1000 mL via INTRAVENOUS

## 2021-01-02 MED FILL — ELINEST 0.3-30 MG-MCG TABS: 0.3-30 | 84 days supply | Qty: 84 | Fill #0

## 2021-01-02 NOTE — Discharge Instructions (Addendum)
Avoid Tylenol, alcohol, Tylenol containing medications. Follow up with GI, referral given. Take Zofran as needed as prescribed for nausea and vomiting. Take Omeprazole daily as prescribed.

## 2021-01-02 NOTE — ED Triage Notes (Signed)
Pt c/o body aches and "legs shaking" x 1 week. Went to UC and had negative covid and flu tests. Was taking toradol and motrin without relief. C/o n/v and lower abd pain. Went to [facility] and was referred here for possible ulcer and eval.

## 2021-01-02 NOTE — ED Provider Notes (Signed)
[facility] DEPT Provider Note   CSN: [id number] Arrival date & time: 01/02/21  1651     History Chief Complaint  Patient presents with  . Abdominal Pain    Tracy Yoder is a 25 y.o. female.  25 year old female presents with complaint of nausea, vomiting, abdominal pain.  Patient states that her symptoms started on Thursday (4 days ago) while in [place] on a business trip.  Patient reports body aches and fever, went to urgent care and was thought to have Covid although tested negative.  Patient was given Toradol to take for pain and is taking this every 6 hours, also taking Tylenol every 6 hours (500 mg).  Patient states that she is unable to eat much more than a few crackers for the past few days due to her abdominal pain, nausea, vomiting.  Pain is located in the upper abdomen, does not radiate.  Reports fevers have resolved.  Denies dysuria, frequency, has not had a bowel movement for several days however attributes this to decreased p.o. intake.  Prior abdominal surgery includes appendectomy.  Patient went to her gynecologist today and was sent to the ED for possible stomach ulcer.        Past Medical History:  Diagnosis Date  . Asthma   . GERD (gastroesophageal reflux disease)     Patient Active Problem List   Diagnosis Date Noted  . History of bilateral breast reduction surgery 09/05/2018  . Breast, fat necrosis 09/05/2018    Past Surgical History:  Procedure Laterality Date  . ADENOIDECTOMY    . APPENDECTOMY    . BREAST REDUCTION SURGERY Bilateral 03/06/2018   Procedure: BILATERAL MAMMARY REDUCTION  (BREAST);  Surgeon: Wallace Going, DO;  Location: [facility];  Service: Plastics;  Laterality: Bilateral;  . MASS EXCISION Left 10/30/2018   Procedure: EXCISION LEFT BREAST FAT NECROSIS;  Surgeon: Wallace Going, DO;  Location: [facility];  Service: Plastics;  Laterality: Left;  .  TONSILLECTOMY AND ADENOIDECTOMY       OB History   No obstetric history on file.     No family history on file.  Social History   Tobacco Use  . Smoking status: Never Smoker  . Smokeless tobacco: Never Used  Vaping Use  . Vaping Use: Former  Substance Use Topics  . Alcohol use: Yes    Comment: occasional  . Drug use: Never    Home Medications Prior to Admission medications   Medication Sig Start Date End Date Taking? Authorizing Provider  omeprazole (PRILOSEC) 20 MG capsule Take 1 capsule (20 mg total) by mouth 2 (two) times daily before a meal for 10 days. 01/02/21 01/12/21 Yes Tacy Learn, PA-C  ondansetron (ZOFRAN ODT) 4 MG disintegrating tablet Take 1 tablet (4 mg total) by mouth every 8 (eight) hours as needed for nausea or vomiting. 01/02/21  Yes Tacy Learn, PA-C  buPROPion (WELLBUTRIN XL) 150 MG 24 hr tablet bupropion HCl XL 150 mg 24 hr tablet, extended release  TAKE 1 TABLET EVERY DAY BY MOUTH    [provider]  Cyndee Brightly 0.25-35 MG-MCG tablet Take 1 tablet by mouth daily. 10/20/20   [provider]  ketorolac (TORADOL) 10 MG tablet Take 1 tablet (10 mg total) by mouth every 6 (six) hours as needed for moderate pain or severe pain. 12/30/20   Coral Spikes, DO  levonorgestrel (MIRENA) 20 MCG/24HR IUD 1 each by Intrauterine route once.  12/30/20  [provider]    Allergies    Patient has no known allergies.  Review of Systems   Review of Systems  Constitutional: Positive for fatigue and fever.  HENT: Negative for congestion.   Respiratory: Negative for cough and shortness of breath.   Cardiovascular: Negative for chest pain.  Gastrointestinal: Positive for abdominal pain, nausea and vomiting. Negative for blood in stool, constipation and diarrhea.  Genitourinary: Negative for dysuria and frequency.  Musculoskeletal: Positive for arthralgias and myalgias.  Skin: Negative for rash and wound.  Allergic/Immunologic: Negative for  immunocompromised state.  Neurological: Positive for weakness and headaches.  Hematological: Negative for adenopathy.  Psychiatric/Behavioral: Negative for confusion.  All other systems reviewed and are negative.   Physical Exam Updated Vital Signs BP 130/81 (BP Location: Left Arm)   Pulse 86   Temp 98.9 F (37.2 C) (Oral)   Resp 12   Ht 5' 2"  (1.575 m)   Wt 79.4 kg   LMP 12/25/2020 (Exact Date)   SpO2 100%   BMI 32.01 kg/m   Physical Exam Vitals and nursing note reviewed.  Constitutional:      General: She is not in acute distress.    Appearance: She is well-developed and well-nourished. She is not diaphoretic.  HENT:     Head: Normocephalic and atraumatic.  Cardiovascular:     Rate and Rhythm: Normal rate and regular rhythm.     Heart sounds: Normal heart sounds.  Pulmonary:     Effort: Pulmonary effort is normal.     Breath sounds: Normal breath sounds.  Abdominal:     Palpations: Abdomen is soft.     Tenderness: There is generalized abdominal tenderness.  Skin:    General: Skin is warm and dry.     Findings: No erythema or rash.  Neurological:     Mental Status: She is alert and oriented to person, place, and time.  Psychiatric:        Mood and Affect: Mood and affect normal.        Behavior: Behavior normal.     ED Results / Procedures / Treatments   Labs (all labs ordered are listed, but only abnormal results are displayed) Labs Reviewed  COMPREHENSIVE METABOLIC PANEL - Abnormal; Notable for the following components:      Result Value   AST 200 (*)    ALT 179 (*)    Alkaline Phosphatase 140 (*)    Total Bilirubin 3.3 (*)    All other components within normal limits  CBC - Abnormal; Notable for the following components:   WBC 3.2 (*)    All other components within normal limits  URINALYSIS, ROUTINE W REFLEX MICROSCOPIC - Abnormal; Notable for the following components:   Color, Urine AMBER (*)    APPearance HAZY (*)    Hgb urine dipstick LARGE (*)     Bilirubin Urine MODERATE (*)    Ketones, ur 5 (*)    Protein, ur 30 (*)    Bacteria, UA RARE (*)    All other components within normal limits  DIFFERENTIAL - Abnormal; Notable for the following components:   Neutro Abs 1.5 (*)    All other components within normal limits  LIPASE, BLOOD  ACETAMINOPHEN LEVEL  HEPATITIS PANEL, ACUTE  I-STAT BETA HCG BLOOD, ED ([facility], [facility], [facility] ONLY)    EKG None  Radiology CT Abdomen Pelvis W Contrast  Result Date: 01/02/2021 CLINICAL DATA:  Body aches and legs shaking EXAM: CT ABDOMEN AND PELVIS WITH CONTRAST TECHNIQUE: Multidetector  CT imaging of the abdomen and pelvis was performed using the standard protocol following bolus administration of intravenous contrast. CONTRAST:  159m OMNIPAQUE IOHEXOL 300 MG/ML  SOLN COMPARISON:  None. FINDINGS: Lower chest: The visualized heart size within normal limits. No pericardial fluid/thickening. No hiatal hernia. The visualized portions of the lungs are clear. Hepatobiliary: The liver is normal in density without focal abnormality.The main portal vein is patent. No evidence of calcified gallstones, gallbladder wall thickening or biliary dilatation. Pancreas: Unremarkable. No pancreatic ductal dilatation or surrounding inflammatory changes. Spleen: Normal in size without focal abnormality. Adrenals/Urinary Tract: Both adrenal glands appear normal. The kidneys and collecting system appear normal without evidence of urinary tract calculus or hydronephrosis. Bladder is unremarkable. Stomach/Bowel: The stomach, small bowel, and colon are normal in appearance. No inflammatory changes, wall thickening, or obstructive findings.The appendix is normal. Vascular/Lymphatic: There are no enlarged mesenteric, retroperitoneal, or pelvic lymph nodes. No significant vascular findings are present. Reproductive: Small amount of fluid seen within the endometrial canal. Other: No abdominal wall hernia is noted. Musculoskeletal: No acute or  significant osseous findings. IMPRESSION: No acute intra-abdominal or pelvic pathology to explain the patient's symptoms. Electronically Signed   By: BPrudencio PairM.D.   On: 01/02/2021 22:35   [place]oreaAbdomen Limited  Result Date: 01/02/2021 CLINICAL DATA:  Elevated liver function tests EXAM: ULTRASOUND ABDOMEN LIMITED RIGHT UPPER QUADRANT COMPARISON:  None. FINDINGS: Gallbladder: No gallstones or wall thickening visualized. No sonographic Zohar Laing sign noted by sonographer. Common bile duct: Diameter: 4 mm Liver: No focal lesion identified. Within normal limits in parenchymal echogenicity. Portal vein is patent on color Doppler imaging with normal direction of blood flow towards the liver. Other: None. IMPRESSION: 1. Unremarkable right upper quadrant ultrasound. Electronically Signed   By: MRanda NgoM.D.   On: 01/02/2021 19:50    Procedures Procedures   Medications Ordered in ED Medications  pantoprazole (PROTONIX) EC tablet 40 mg (has no administration in time range)  sodium chloride 0.9 % bolus 1,000 mL (0 mLs Intravenous Stopped 01/02/21 2241)  morphine 4 MG/ML injection 4 mg (4 mg Intravenous Given 01/02/21 2019)  ondansetron (ZOFRAN) injection 4 mg (4 mg Intravenous Given 01/02/21 2019)  iohexol (OMNIPAQUE) 300 MG/ML solution 100 mL (100 mLs Intravenous Contrast Given 01/02/21 2207)  ondansetron (ZOFRAN) injection 4 mg (4 mg Intravenous Given 01/02/21 2251)  HYDROmorphone (DILAUDID) injection 0.5 mg (0.5 mg Intravenous Given 01/02/21 2250)    ED Course  I have reviewed the triage vital signs and the nursing notes.  Pertinent labs & imaging results that were available during my care of the patient were reviewed by me and considered in my medical decision making (see chart for details).  Clinical Course as of 01/02/21 2316  Mon [birth year], [address] [age] old female with complaint of abdominal pain with nausea, vomiting, fatigue, onset 4 days ago. Taking Tylenol and Motrin for fevers and  pain.  On exam, generalized abdominal tenderness. COVID/flu negative at [facility]  [LM]  2313 CBC with WBC 3.2, CMP with elevated LFTs (AST 200, ALT 179, alk phos 140, bili 3.3). Tylenol 12 , lipase WNL. [place]Q without acute findings. CT abdomen/pelvis for generalized abdominal tenderness, no acute findings. Discussed with Dr. KTomi Bamberger ER attending, suspect viral illness/hepatitis, acute hepatitis panel sent out.  [LM]  2314 Discussed results and plan of care with patient and her mother, plan is for dc to follow up with GI, given rx for zofran and omeprazole. Recommend avoiding Tylenol and alcohol.  Return precautions given.  [LM]    Clinical Course User Index [LM] Roque Lias   MDM Rules/Calculators/A&P                          Final Clinical Impression(s) / ED Diagnoses Final diagnoses:  Elevated LFTs  Generalized abdominal pain    Rx / DC Orders ED Discharge Orders         Ordered    ondansetron (ZOFRAN ODT) 4 MG disintegrating tablet  Every 8 hours PRN        01/02/21 2258    omeprazole (PRILOSEC) 20 MG capsule  2 times daily before meals        01/02/21 2258           Roque Lias 01/02/21 2316    Dorie Rank, MD 01/03/21 [phone]

## 2021-01-03 LAB — HEPATITIS PANEL, ACUTE
HCV Ab: NONREACTIVE
Hep A IgM: NONREACTIVE
Hep B C IgM: NONREACTIVE
Hepatitis B Surface Ag: NONREACTIVE

## 2021-01-04 ENCOUNTER — Other Ambulatory Visit: Payer: Self-pay

## 2021-01-04 ENCOUNTER — Ambulatory Visit: Payer: Self-pay | Admitting: *Deleted

## 2021-01-04 ENCOUNTER — Inpatient Hospital Stay (HOSPITAL_BASED_OUTPATIENT_CLINIC_OR_DEPARTMENT_OTHER)
Admission: EM | Admit: 2021-01-04 | Discharge: 2021-01-07 | DRG: 948 | Disposition: A | Discharge status: Home / Self Care | Payer: 59 | Attending: Internal Medicine | Admitting: Internal Medicine

## 2021-01-04 ENCOUNTER — Encounter (HOSPITAL_BASED_OUTPATIENT_CLINIC_OR_DEPARTMENT_OTHER): Payer: Self-pay | Admitting: Emergency Medicine

## 2021-01-04 DIAGNOSIS — R109 Unspecified abdominal pain: Secondary | ICD-10-CM

## 2021-01-04 DIAGNOSIS — Z6832 Body mass index (BMI) 32.0-32.9, adult: Secondary | ICD-10-CM

## 2021-01-04 DIAGNOSIS — R7401 Elevation of levels of liver transaminase levels: Principal | ICD-10-CM | POA: Diagnosis present

## 2021-01-04 DIAGNOSIS — D649 Anemia, unspecified: Secondary | ICD-10-CM | POA: Diagnosis present

## 2021-01-04 DIAGNOSIS — R1084 Generalized abdominal pain: Secondary | ICD-10-CM | POA: Diagnosis present

## 2021-01-04 DIAGNOSIS — K5900 Constipation, unspecified: Secondary | ICD-10-CM | POA: Diagnosis present

## 2021-01-04 DIAGNOSIS — K219 Gastro-esophageal reflux disease without esophagitis: Secondary | ICD-10-CM | POA: Diagnosis present

## 2021-01-04 DIAGNOSIS — R748 Abnormal levels of other serum enzymes: Secondary | ICD-10-CM | POA: Diagnosis present

## 2021-01-04 DIAGNOSIS — J45909 Unspecified asthma, uncomplicated: Secondary | ICD-10-CM | POA: Diagnosis present

## 2021-01-04 DIAGNOSIS — Z20822 Contact with and (suspected) exposure to covid-19: Secondary | ICD-10-CM | POA: Diagnosis present

## 2021-01-04 DIAGNOSIS — R112 Nausea with vomiting, unspecified: Secondary | ICD-10-CM | POA: Diagnosis present

## 2021-01-04 DIAGNOSIS — R823 Hemoglobinuria: Secondary | ICD-10-CM | POA: Diagnosis present

## 2021-01-04 DIAGNOSIS — T398X5A Adverse effect of other nonopioid analgesics and antipyretics, not elsewhere classified, initial encounter: Secondary | ICD-10-CM | POA: Diagnosis present

## 2021-01-04 DIAGNOSIS — Z79899 Other long term (current) drug therapy: Secondary | ICD-10-CM

## 2021-01-04 DIAGNOSIS — E669 Obesity, unspecified: Secondary | ICD-10-CM | POA: Diagnosis present

## 2021-01-04 LAB — URINALYSIS, ROUTINE W REFLEX MICROSCOPIC
Glucose, UA: NEGATIVE mg/dL
Ketones, ur: >80 mg/dL — AB
Leukocytes,Ua: NEGATIVE
Nitrite: NEGATIVE
Protein, ur: NEGATIVE mg/dL
Specific Gravity, Urine: 1.02 (ref 1.005–1.030)
pH: 6 (ref 5.0–8.0)

## 2021-01-04 LAB — URINALYSIS, MICROSCOPIC (REFLEX)

## 2021-01-04 LAB — RESP PANEL BY RT-PCR (FLU A&B, COVID) ARPGX2
Influenza A by PCR: NEGATIVE
Influenza B by PCR: NEGATIVE
SARS Coronavirus 2 by RT PCR: NEGATIVE

## 2021-01-04 LAB — PROTIME-INR
INR: 1 (ref 0.8–1.2)
Prothrombin Time: 13 seconds (ref 11.4–15.2)

## 2021-01-04 LAB — CBC
HCT: 40 % (ref 36.0–46.0)
Hemoglobin: 13.6 g/dL (ref 12.0–15.0)
MCH: 28.8 pg (ref 26.0–34.0)
MCHC: 34 g/dL (ref 30.0–36.0)
MCV: 84.7 fL (ref 80.0–100.0)
Platelets: 206 10*3/uL (ref 150–400)
RBC: 4.72 MIL/uL (ref 3.87–5.11)
RDW: 13.2 % (ref 11.5–15.5)
WBC: 5.1 10*3/uL (ref 4.0–10.5)
nRBC: 0 % (ref 0.0–0.2)

## 2021-01-04 LAB — COMPREHENSIVE METABOLIC PANEL
ALT: 326 U/L — ABNORMAL HIGH (ref 0–44)
AST: 315 U/L — ABNORMAL HIGH (ref 15–41)
Albumin: 3.8 g/dL (ref 3.5–5.0)
Alkaline Phosphatase: 168 U/L — ABNORMAL HIGH (ref 38–126)
Anion gap: 13 (ref 5–15)
BUN: 7 mg/dL (ref 6–20)
CO2: 23 mmol/L (ref 22–32)
Calcium: 9 mg/dL (ref 8.9–10.3)
Chloride: 98 mmol/L (ref 98–111)
Creatinine, Ser: 0.72 mg/dL (ref 0.44–1.00)
GFR, Estimated: >60 mL/min (ref >60)
Glucose, Bld: 84 mg/dL (ref 70–99)
Potassium: 3.6 mmol/L (ref 3.5–5.1)
Sodium: 134 mmol/L — ABNORMAL LOW (ref 135–145)
Total Bilirubin: 2.6 mg/dL — ABNORMAL HIGH (ref 0.3–1.2)
Total Protein: 7.8 g/dL (ref 6.5–8.1)

## 2021-01-04 LAB — LIPASE, BLOOD: Lipase: 56 U/L — ABNORMAL HIGH (ref 11–51)

## 2021-01-04 LAB — PREGNANCY, URINE: Preg Test, Ur: NEGATIVE

## 2021-01-04 LAB — MONONUCLEOSIS SCREEN: Mono Screen: NEGATIVE

## 2021-01-04 MED ORDER — ONDANSETRON HCL 4 MG/2ML IJ SOLN
4.0000 mg | Freq: Once | INTRAMUSCULAR | Status: AC
  Administered 2021-01-04: 4 mg via INTRAVENOUS
  Filled 2021-01-04: qty 2

## 2021-01-04 MED ORDER — MORPHINE SULFATE (PF) 2 MG/ML IV SOLN
2.0000 mg | Freq: Once | INTRAVENOUS | Status: AC
  Administered 2021-01-04: 2 mg via INTRAVENOUS
  Filled 2021-01-04: qty 1

## 2021-01-04 MED ORDER — MORPHINE SULFATE (PF) 2 MG/ML IV SOLN
2.0000 mg | Freq: Once | INTRAVENOUS | Status: AC
  Administered 2021-01-05: 2 mg via INTRAVENOUS
  Filled 2021-01-04: qty 1

## 2021-01-04 MED ORDER — SODIUM CHLORIDE 0.9 % IV BOLUS
1000.0000 mL | Freq: Once | INTRAVENOUS | Status: AC
  Administered 2021-01-04: 1000 mL via INTRAVENOUS

## 2021-01-04 NOTE — Telephone Encounter (Signed)
Pt called with complaints of "abdominal pain and bowel problems" since she was seen in the ED on 01/02/21; she has taken omeprazole as prescribed; she complains of burning sensation in her stomach; she says her stomach is "steadilyl growing";her last BM was 12/31/20 because she tool a laxative; the pt says she had applesauce and toast but her symptoms continue to worsen; she has an appt to see GI she next week; the pt says she has contacted her PCP but they will not see her because she has COVID symptoms; recommendations made per nurse triage protocol; she and her mother verbalized understanding.  Reason for Disposition . [1] SEVERE pain (e.g., excruciating) AND [2] present > 1 hour  Answer Assessment - Initial Assessment Questions 1. LOCATION: "Where does it hurt?"       2. RADIATION: "Does the pain shoot anywhere else?" (e.g., chest, back)      3. ONSET: "When did the pain begin?" (e.g., minutes, hours or days ago)  Seen in ED on 01/02/21 4. SUDDEN: "Gradual or sudden onset?"     5. PATTERN "Does the pain come and go, or is it constant?"    - If constant: "Is it getting better, staying the same, or worsening?"      (Note: Constant means the pain never goes away completely; most serious pain is constant and it progresses)     - If intermittent: "How long does it last?" "Do you have pain now?"     (Note: Intermittent means the pain goes away completely between bouts)     constant 6. SEVERITY: "How bad is the pain?"  (e.g., Scale 1-10; mild, moderate, or severe)   - MILD (1-3): doesn't interfere with normal activities, abdomen soft and not tender to touch    - MODERATE (4-7): interferes with normal activities or awakens from sleep, tender to touch    - SEVERE (8-10): excruciating pain, doubled over, unable to do any normal activities    Moderate to severe 7. RECURRENT SYMPTOM: "Have you ever had this type of stomach pain before?" If Yes, ask: "When was the last time?" and "What happened that  time?"      Seen in ED on 01/02/21 8. CAUSE: "What do you think is causing the stomach pain?"     Not sure 9. RELIEVING/AGGRAVATING FACTORS: "What makes it better or worse?" (e.g., movement, antacids, bowel movement)     Patient had omeprazole and laxative but symptoms worsening 10. OTHER SYMPTOMS: "Has there been any vomiting, diarrhea, constipation, or urine problems?"   "stomach growing"; unable to eat 11. PREGNANCY: "Is there any chance you are pregnant?" "When was your last menstrual period?"  Protocols used: ABDOMINAL PAIN - [facility]

## 2021-01-04 NOTE — Plan of Care (Signed)
[facility] will assume care on arrival to accepting facility. Until arrival, care as per EDP. However, [facility] available 24/7 for questions and assistance.  

## 2021-01-04 NOTE — ED Triage Notes (Signed)
Pt c/o right lower abd pain that started approx over a week ago. Pt states that she was seen at [facility] and dx with liver infection and elevated liver enzymes. Pt has follow up with GI on Monday but is unable to tolerate pain and has been nauseated and vomiting all day today. Pt aaox3, ambulatory with steady gait, VSS, GCS 15.

## 2021-01-04 NOTE — ED Provider Notes (Signed)
[facility] EMERGENCY DEPARTMENT Provider Note   CSN: [id number] Arrival date & time: 01/04/21  1639     History Chief Complaint  Patient presents with  . Abdominal Pain    Tracy Yoder is a 25 y.o. female past medical history of asthma, GERD, appendectomy, presenting emergency department with complaint of abdominal pain.  Per review of medical record, patient was evaluated by urgent care 12/30/2020, as well as the ED on 01/02/2021 for the symptoms. Patient reports symptoms began when she woke up Thursday night with body aches shaking.  She had gone on a business trip earlier in the week and thought maybe she had picked up something while traveling.  She states Friday she had body aches and felt ill.  Her Covid and flu swabs are negative at urgent care.  The following day developed abdominal pain has right-sided.  She states initially it was more discomfort with nausea.  She states she was evaluated at the ED and she was found to have transaminitis.  She was thought to have hepatitis and was discharged with symptomatic management. She has been taking omeprazole, Toradol, Zofran for her symptoms without relief. Today her symptoms worsened with more sharp pains in her right abdomen.  Pains are worse with eating.  She has associated nausea and quite a bit of belching.  She feels bloated in her abdomen.  Per review of medical record, her hepatitis panel was negative.  Tylenol level was undetectable.  Her Covid and flu swabs are negative.  She had CT abdomen pelvis as well as right upper quadrant ultrasound and all were unremarkable.  The history is provided by the patient.       Past Medical History:  Diagnosis Date  . Asthma   . GERD (gastroesophageal reflux disease)     Patient Active Problem List   Diagnosis Date Noted  . History of bilateral breast reduction surgery 09/05/2018  . Breast, fat necrosis 09/05/2018    Past Surgical History:  Procedure Laterality Date   . ADENOIDECTOMY    . APPENDECTOMY    . BREAST REDUCTION SURGERY Bilateral 03/06/2018   Procedure: BILATERAL MAMMARY REDUCTION  (BREAST);  Surgeon: Peggye Form, DO;  Location: [facility];  Service: Plastics;  Laterality: Bilateral;  . MASS EXCISION Left 10/30/2018   Procedure: EXCISION LEFT BREAST FAT NECROSIS;  Surgeon: Peggye Form, DO;  Location:  SURGERY CENTER;  Service: Plastics;  Laterality: Left;  . TONSILLECTOMY AND ADENOIDECTOMY       OB History   No obstetric history on file.     No family history on file.  Social History   Tobacco Use  . Smoking status: Never Smoker  . Smokeless tobacco: Never Used  Vaping Use  . Vaping Use: Former  Substance Use Topics  . Alcohol use: Yes    Comment: occasional  . Drug use: Never    Home Medications Prior to Admission medications   Medication Sig Start Date End Date Taking? Authorizing Provider  buPROPion (WELLBUTRIN XL) 150 MG 24 hr tablet bupropion HCl XL 150 mg 24 hr tablet, extended release  TAKE 1 TABLET EVERY DAY BY MOUTH    [provider]  Randa Ngo 0.25-35 MG-MCG tablet Take 1 tablet by mouth daily. 10/20/20   [provider]  ketorolac (TORADOL) 10 MG tablet Take 1 tablet (10 mg total) by mouth every 6 (six) hours as needed for moderate pain or severe pain. 12/30/20   Tommie Sams, DO  omeprazole (PRILOSEC) 20 MG capsule Take 1 capsule (20 mg total) by mouth 2 (two) times daily before a meal for 10 days. 01/02/21 01/12/21  Jeannie Fend, PA-C  ondansetron (ZOFRAN ODT) 4 MG disintegrating tablet Take 1 tablet (4 mg total) by mouth every 8 (eight) hours as needed for nausea or vomiting. 01/02/21   Jeannie Fend, PA-C  levonorgestrel (MIRENA) 20 MCG/24HR IUD 1 each by Intrauterine route once.  12/30/20  [provider]    Allergies    Patient has no known allergies.  Review of Systems   Review of Systems  Gastrointestinal: Positive for abdominal  distention, abdominal pain, nausea and vomiting.  All other systems reviewed and are negative.   Physical Exam Updated Vital Signs BP 118/73 (BP Location: Right Arm)   Pulse 88   Temp 98.1 F (36.7 C) (Oral)   Resp 18   Ht 5\' 2"  (1.575 m)   Wt 77.1 kg   LMP 01/04/2021   SpO2 100%   BMI 31.09 kg/m   Physical Exam Vitals and nursing note reviewed.  Constitutional:      General: She is not in acute distress.    Appearance: She is well-developed and well-nourished. She is not ill-appearing.  HENT:     Head: Normocephalic and atraumatic.  Eyes:     General: No scleral icterus.    Conjunctiva/sclera: Conjunctivae normal.  Cardiovascular:     Rate and Rhythm: Normal rate and regular rhythm.  Pulmonary:     Effort: Pulmonary effort is normal. No respiratory distress.     Breath sounds: Normal breath sounds.  Abdominal:     General: Abdomen is flat. Bowel sounds are normal.     Palpations: Abdomen is soft.     Tenderness: There is abdominal tenderness in the right upper quadrant, right lower quadrant, periumbilical area and suprapubic area. There is no guarding or rebound.  Skin:    General: Skin is warm.  Neurological:     Mental Status: She is alert.  Psychiatric:        Mood and Affect: Mood and affect normal.        Behavior: Behavior normal.     ED Results / Procedures / Treatments   Labs (all labs ordered are listed, but only abnormal results are displayed) Labs Reviewed  LIPASE, BLOOD - Abnormal; Notable for the following components:      Result Value   Lipase 56 (*)    All other components within normal limits  COMPREHENSIVE METABOLIC PANEL - Abnormal; Notable for the following components:   Sodium 134 (*)    AST 315 (*)    ALT 326 (*)    Alkaline Phosphatase 168 (*)    Total Bilirubin 2.6 (*)    All other components within normal limits  URINALYSIS, ROUTINE W REFLEX MICROSCOPIC - Abnormal; Notable for the following components:   Hgb urine dipstick SMALL  (*)    Bilirubin Urine LARGE (*)    Ketones, ur >80 (*)    All other components within normal limits  URINALYSIS, MICROSCOPIC (REFLEX) - Abnormal; Notable for the following components:   Bacteria, UA RARE (*)    All other components within normal limits  RESP PANEL BY RT-PCR (FLU A&B, COVID) ARPGX2  CBC  PREGNANCY, URINE  MONONUCLEOSIS SCREEN  PROTIME-INR    EKG None  Radiology CT Abdomen Pelvis W Contrast  Result Date: 01/02/2021 CLINICAL DATA:  Body aches and legs shaking EXAM: CT ABDOMEN AND PELVIS WITH CONTRAST TECHNIQUE:  Multidetector CT imaging of the abdomen and pelvis was performed using the standard protocol following bolus administration of intravenous contrast. CONTRAST:  OMNIPAQUE IOHEXOL 300 MG/ML  SOLN COMPARISON:  None. FINDINGS: Lower chest: The visualized heart size within normal limits. No pericardial fluid/thickening. No hiatal hernia. The visualized portions of the lungs are clear. Hepatobiliary: The liver is normal in density without focal abnormality.The main portal vein is patent. No evidence of calcified gallstones, gallbladder wall thickening or biliary dilatation. Pancreas: Unremarkable. No pancreatic ductal dilatation or surrounding inflammatory changes. Spleen: Normal in size without focal abnormality. Adrenals/Urinary Tract: Both adrenal glands appear normal. The kidneys and collecting system appear normal without evidence of urinary tract calculus or hydronephrosis. Bladder is unremarkable. Stomach/Bowel: The stomach, small bowel, and colon are normal in appearance. No inflammatory changes, wall thickening, or obstructive findings.The appendix is normal. Vascular/Lymphatic: There are no enlarged mesenteric, retroperitoneal, or pelvic lymph nodes. No significant vascular findings are present. Reproductive: Small amount of fluid seen within the endometrial canal. Other: No abdominal wall hernia is noted. Musculoskeletal: No acute or significant osseous findings.  IMPRESSION: No acute intra-abdominal or pelvic pathology to explain the patient's symptoms. Electronically Signed   By: Jonna Clark M.D.   On: 01/02/2021 22:35    Procedures Procedures   Medications Ordered in ED Medications  ondansetron (ZOFRAN) injection 4 mg (4 mg Intravenous Given 01/04/21 1816)  sodium chloride 0.9 % bolus 1,000 mL ( Intravenous Stopped 01/04/21 1922)  morphine 2 MG/ML injection 2 mg (2 mg Intravenous Given 01/04/21 2042)    ED Course  I have reviewed the triage vital signs and the nursing notes.  Pertinent labs & imaging results that were available during my care of the patient were reviewed by me and considered in my medical decision making (see chart for details).  Clinical Course as of 01/04/21 2145  Wed Jan 04, 2021  2103 Consulted with Dr. Lavon Paganini with on-call GI.  Will admit for further evaluation.   [JR]    Clinical Course User Index [JR] Tereza Gilham, [place] N, PA-C   MDM Rules/Calculators/A&P                          Patient here with worsening abdominal pain after being evaluated a few days ago in the ED for the same.  She was noted to have transaminitis at the time with negative CT of the abdomen as well as negative right upper quadrant ultrasound.  She was thought to have likely viral hepatitis and sent home with symptomatic management.  Pain worsened today with sharp pains.  She is now feeling very bloated with lots of belching, nausea and vomiting persists.  She is afebrile.  LFTs are showing some elevation since 2 days ago.  Her acute hepatitis panel was negative per review of medical record.  Mono screen is also negative.  INR is within normal limits at 1.  Consider admission for MRCP for further assessment of worsening transaminitis and abdominal pain. Consult was placed to gastroenterology for further recommendations.  Discussed with Dr. Lavon Paganini, agrees with admission.  GI to consult on patient in the morning.  Final Clinical Impression(s) / ED  Diagnoses Final diagnoses:  Transaminitis  Abdominal pain, unspecified abdominal location    Rx / DC Orders ED Discharge Orders    None       Arti Trang, [place] N, PA-C 01/04/21 2146    Maia Plan, MD 01/12/21 1001

## 2021-01-05 ENCOUNTER — Encounter (HOSPITAL_COMMUNITY): Payer: Self-pay | Admitting: Internal Medicine

## 2021-01-05 DIAGNOSIS — R823 Hemoglobinuria: Secondary | ICD-10-CM | POA: Diagnosis not present

## 2021-01-05 DIAGNOSIS — D649 Anemia, unspecified: Secondary | ICD-10-CM | POA: Diagnosis present

## 2021-01-05 DIAGNOSIS — Z6832 Body mass index (BMI) 32.0-32.9, adult: Secondary | ICD-10-CM | POA: Diagnosis not present

## 2021-01-05 DIAGNOSIS — K5900 Constipation, unspecified: Secondary | ICD-10-CM | POA: Diagnosis present

## 2021-01-05 DIAGNOSIS — R7401 Elevation of levels of liver transaminase levels: Secondary | ICD-10-CM | POA: Diagnosis not present

## 2021-01-05 DIAGNOSIS — Z20822 Contact with and (suspected) exposure to covid-19: Secondary | ICD-10-CM | POA: Diagnosis not present

## 2021-01-05 DIAGNOSIS — J45909 Unspecified asthma, uncomplicated: Secondary | ICD-10-CM | POA: Diagnosis present

## 2021-01-05 DIAGNOSIS — T398X5A Adverse effect of other nonopioid analgesics and antipyretics, not elsewhere classified, initial encounter: Secondary | ICD-10-CM | POA: Diagnosis not present

## 2021-01-05 DIAGNOSIS — K219 Gastro-esophageal reflux disease without esophagitis: Secondary | ICD-10-CM | POA: Diagnosis present

## 2021-01-05 DIAGNOSIS — R1084 Generalized abdominal pain: Secondary | ICD-10-CM | POA: Diagnosis not present

## 2021-01-05 DIAGNOSIS — R748 Abnormal levels of other serum enzymes: Secondary | ICD-10-CM | POA: Diagnosis present

## 2021-01-05 DIAGNOSIS — Z79899 Other long term (current) drug therapy: Secondary | ICD-10-CM | POA: Diagnosis not present

## 2021-01-05 DIAGNOSIS — E669 Obesity, unspecified: Secondary | ICD-10-CM | POA: Diagnosis present

## 2021-01-05 DIAGNOSIS — R112 Nausea with vomiting, unspecified: Secondary | ICD-10-CM | POA: Diagnosis not present

## 2021-01-05 LAB — COMPREHENSIVE METABOLIC PANEL
ALT: 253 U/L — ABNORMAL HIGH (ref 0–44)
AST: 221 U/L — ABNORMAL HIGH (ref 15–41)
Albumin: 2.8 g/dL — ABNORMAL LOW (ref 3.5–5.0)
Alkaline Phosphatase: 121 U/L (ref 38–126)
Anion gap: 12 (ref 5–15)
BUN: 5 mg/dL — ABNORMAL LOW (ref 6–20)
CO2: 18 mmol/L — ABNORMAL LOW (ref 22–32)
Calcium: 8.5 mg/dL — ABNORMAL LOW (ref 8.9–10.3)
Chloride: 105 mmol/L (ref 98–111)
Creatinine, Ser: 0.84 mg/dL (ref 0.44–1.00)
GFR, Estimated: >60 mL/min (ref >60)
Glucose, Bld: 76 mg/dL (ref 70–99)
Potassium: 3.9 mmol/L (ref 3.5–5.1)
Sodium: 135 mmol/L (ref 135–145)
Total Bilirubin: 2.3 mg/dL — ABNORMAL HIGH (ref 0.3–1.2)
Total Protein: 5.7 g/dL — ABNORMAL LOW (ref 6.5–8.1)

## 2021-01-05 LAB — IRON AND TIBC
Iron: 57 ug/dL (ref 28–170)
Saturation Ratios: 12 % (ref 10.4–31.8)
TIBC: 487 ug/dL — ABNORMAL HIGH (ref 250–450)
UIBC: 430 ug/dL

## 2021-01-05 LAB — FERRITIN: Ferritin: 260 ng/mL (ref 11–307)

## 2021-01-05 LAB — HIV ANTIBODY (ROUTINE TESTING W REFLEX): HIV Screen 4th Generation wRfx: NONREACTIVE

## 2021-01-05 MED ORDER — ONDANSETRON HCL 4 MG/2ML IJ SOLN
4.0000 mg | Freq: Four times a day (QID) | INTRAMUSCULAR | Status: DC | PRN
  Administered 2021-01-05 – 2021-01-07 (×4): 4 mg via INTRAVENOUS
  Filled 2021-01-05 (×4): qty 2

## 2021-01-05 MED ORDER — ONDANSETRON HCL 4 MG PO TABS: 4.0000 mg | ORAL_TABLET | Freq: Four times a day (QID) | ORAL | Status: DC | PRN

## 2021-01-05 MED ORDER — PROMETHAZINE HCL 25 MG/ML IJ SOLN
12.5000 mg | Freq: Four times a day (QID) | INTRAMUSCULAR | Status: DC | PRN
  Administered 2021-01-05 – 2021-01-06 (×3): 12.5 mg via INTRAVENOUS
  Filled 2021-01-05 (×3): qty 1

## 2021-01-05 MED ORDER — PANTOPRAZOLE SODIUM 40 MG IV SOLR
40.0000 mg | Freq: Two times a day (BID) | INTRAVENOUS | Status: DC
  Administered 2021-01-05 – 2021-01-07 (×4): 40 mg via INTRAVENOUS
  Filled 2021-01-05 (×4): qty 40

## 2021-01-05 MED ORDER — PANTOPRAZOLE SODIUM 40 MG IV SOLR
40.0000 mg | INTRAVENOUS | Status: DC
  Administered 2021-01-05: 40 mg via INTRAVENOUS
  Filled 2021-01-05: qty 40

## 2021-01-05 MED ORDER — LIP MEDEX EX OINT
TOPICAL_OINTMENT | CUTANEOUS | Status: DC | PRN
  Filled 2021-01-05: qty 7

## 2021-01-05 MED ORDER — MORPHINE SULFATE (PF) 2 MG/ML IV SOLN
2.0000 mg | INTRAVENOUS | Status: DC | PRN
  Administered 2021-01-05 – 2021-01-06 (×10): 2 mg via INTRAVENOUS
  Filled 2021-01-05 (×11): qty 1

## 2021-01-05 MED ORDER — SUCRALFATE 1 GM/10ML PO SUSP
1.0000 g | Freq: Three times a day (TID) | ORAL | Status: DC
  Administered 2021-01-05 – 2021-01-07 (×7): 1 g via ORAL
  Filled 2021-01-05 (×7): qty 10

## 2021-01-05 MED ORDER — POLYETHYLENE GLYCOL 3350 17 G PO PACK
17.0000 g | PACK | Freq: Every day | ORAL | Status: DC
  Administered 2021-01-05 – 2021-01-07 (×3): 17 g via ORAL
  Filled 2021-01-05 (×3): qty 1

## 2021-01-05 MED ORDER — POTASSIUM CHLORIDE 2 MEQ/ML IV SOLN
INTRAVENOUS | Status: DC
  Filled 2021-01-05 (×6): qty 1000

## 2021-01-05 NOTE — Consult Note (Addendum)
Referring Provider: Dr. Jacqulyn Bath Primary Care Physician:  Ginette Otto, [facility] Gastroenterologist:  Gentry Fitz  Reason for Consultation:  Elevated LFTs  HPI: Tracy Yoder is a 25 y.o. female with PMH of asthma and GERD who had the acute onset of body aches, fever to 102 last Friday and abdominal pain. Abdominal pain was across her upper abdomen and in both flanks. Now having 5-6/10 upper abdominal pain and periumbilical pain as well as epigastric pain. Occasionally drinks alcohol and last had alcohol the weekend of Feb 19th. Drinks 2-3 drinks at a time when she does drink. Went to urgent care last Friday and was given Toradol which she took Q 6 hours for almost 4 days. Also took Tylenol 500 mg Q 6 hours during this time. No other new meds. She denies any herbal therapies. Parents who are in the room deny any childhood issues with abdominal pain. History of GERD but has not regularly been on a PPI for that. LFTs with TB 3.3, ALP 140, AST 200, ALT 179 on admit 01/02/21. AST/ALT 315/326 yesterday. She is belching and has heartburn with nausea/vomiting with ice chips. CT negative for acute findings. Nurse present during my evaluation.  Past Medical History:  Diagnosis Date  . Asthma   . GERD (gastroesophageal reflux disease)     Past Surgical History:  Procedure Laterality Date  . APPENDECTOMY    . BREAST REDUCTION SURGERY Bilateral 03/06/2018   Procedure: BILATERAL MAMMARY REDUCTION  (BREAST);  Surgeon: Peggye Form, DO;  Location: [facility];  Service: Plastics;  Laterality: Bilateral;  . MASS EXCISION Left 10/30/2018   Procedure: EXCISION LEFT BREAST FAT NECROSIS;  Surgeon: Peggye Form, DO;  Location: [facility];  Service: Plastics;  Laterality: Left;  . TONSILLECTOMY AND ADENOIDECTOMY      Prior to Admission medications   Medication Sig Start Date End Date Taking? Authorizing Provider  buPROPion (WELLBUTRIN XL) 150 MG 24  hr tablet bupropion HCl XL 150 mg 24 hr tablet, extended release  TAKE 1 TABLET EVERY DAY BY MOUTH    [provider]  ELINEST 0.3-30 MG-MCG tablet Take 1 tablet by mouth daily. 01/02/21   [provider]  Randa Ngo 0.25-35 MG-MCG tablet Take 1 tablet by mouth daily. 10/20/20   [provider]  ketorolac (TORADOL) 10 MG tablet Take 1 tablet (10 mg total) by mouth every 6 (six) hours as needed for moderate pain or severe pain. 12/30/20   Tommie Sams, DO  omeprazole (PRILOSEC) 20 MG capsule Take 1 capsule (20 mg total) by mouth 2 (two) times daily before a meal for 10 days. 01/02/21 01/12/21  Jeannie Fend, PA-C  ondansetron (ZOFRAN ODT) 4 MG disintegrating tablet Take 1 tablet (4 mg total) by mouth every 8 (eight) hours as needed for nausea or vomiting. 01/02/21   Jeannie Fend, PA-C  levonorgestrel (MIRENA) 20 MCG/24HR IUD 1 each by Intrauterine route once.  12/30/20  [provider]    Scheduled Meds: . pantoprazole (PROTONIX) IV  40 mg Intravenous Q24H   Continuous Infusions: . lactated ringers with kcl 100 mL/hr at 01/05/21 0407   PRN Meds:.lip balm, morphine injection, ondansetron **OR** ondansetron (ZOFRAN) IV  Allergies as of 01/04/2021  . (No Known Allergies)    Family History  Problem Relation Age of Onset  . Urolithiasis Mother   . Urolithiasis Father   . Diabetes Mellitus II Maternal Grandmother   . Diabetes Mellitus II Maternal Grandfather  Social History   Socioeconomic History  . Marital status: Single    Spouse name: Not on file  . Number of children: Not on file  . Years of education: Not on file  . Highest education level: Not on file  Occupational History  . Not on file  Tobacco Use  . Smoking status: Never Smoker  . Smokeless tobacco: Never Used  Vaping Use  . Vaping Use: Former  Substance and Sexual Activity  . Alcohol use: Yes    Comment: occasional  . Drug use: Never  . Sexual activity: Yes    Birth  control/protection: I.U.D.  Other Topics Concern  . Not on file  Social History Narrative  . Not on file   Social Determinants of Health   Financial Resource Strain: Not on file  Food Insecurity: Not on file  Transportation Needs: Not on file  Physical Activity: Not on file  Stress: Not on file  Social Connections: Not on file  Intimate Partner Violence: Not on file    Review of Systems: All negative except as stated above in HPI.  Physical Exam: Vital signs: Vitals:   01/05/21 0418 01/05/21 1452  BP: 104/73 119/79  Pulse: 74 68  Resp: 18 17  Temp: 98.2 F (36.8 C) 97.8 F (36.6 C)  SpO2: 100% 100%   Last BM Date: 12/31/20 General:   Alert,  Well-developed, well-nourished, pleasant and cooperative in NAD Head: normocephalic, atraumatic Eyes: anicteric sclera ENT: oropharynx clear Neck: supple, nontender Lungs:  Clear throughout to auscultation.   No wheezes, crackles, or rhonchi. No acute distress. Heart:  Regular rate and rhythm; no murmurs, clicks, rubs,  or gallops. Abdomen: diffuse tenderness with guarding (greatest in upper quadrants), soft, nondistended, +BS, tympanic to percussion, liver edge not palpable  Rectal:  Deferred Ext: no edema  GI:  Lab Results: Recent Labs    01/02/21 1735 01/04/21 1730  WBC 3.2* 5.1  HGB 14.0 13.6  HCT 41.3 40.0  PLT 189 206   BMET Recent Labs    01/02/21 1735 01/04/21 1730 01/05/21 0442  NA 138 134* 135  K 3.5 3.6 3.9  CL 103 98 105  CO2 24 23 18*  GLUCOSE 92 84 76  BUN 13 7 5*  CREATININE 0.76 0.72 0.84  CALCIUM 9.2 9.0 8.5*   LFT Recent Labs    01/05/21 0442  PROT 5.7*  ALBUMIN 2.8*  AST 221*  ALT 253*  ALKPHOS 121  BILITOT 2.3*   PT/INR Recent Labs    01/04/21 2110  LABPROT 13.0  INR 1.0     Studies/Results: No results found.  Impression/Plan: Elevated LFTs with negative acute hepatitis panel and negative mono test. I think her transaminitis is due to medicines specifically the  Toradol she was recently taking and has since stopped. Viral source still possible. Doubt autoimmune hepatitis or iron overload but will check autoimmune markers and iron studies. I do not think a liver biopsy is needed at this time. Follow LFTs closely. Avoid any potentially offending meds. Ice chips and if tolerates advance diet slowly. Increased PPI to Q 12 hours. Sucralfate added and cautioned that it could cause constipation. May need Miralax if constipation persists despite advancing her diet. Will follow.    LOS: 0 days   Shirley Friar  01/05/2021, 4:03 PM  Questions please call [phone]

## 2021-01-05 NOTE — Progress Notes (Signed)
PROGRESS NOTE    Tracy Yoder  JKK:[id number] DOB: 06-Aug-1996 DOA: 01/04/2021 PCP: Ginette Otto, [facility]   Brief Narrative:  Tracy Yoder is a 25 y.o. female with medical history significant of asthma, GERD, who is coming from [facility] where she presented with a 1 week history of abdominal pain associated initially with nausea and multiple episodes of emesis in the first few days, but none since Monday.  The patient also stated that she has been feeling bloated and constipated since Saturday.  She went to the urgent care on 12/30/2020 and then went to the ED on 01/02/2021. No history of smoking, alcohol, IV drug abuse.  ED course: Vital signs stable, receive IV fluid, morphine and Zofran in ED.  UA positive for hemoglobin, bilirubin and ketones.  Afebrile with no leukocytosis.  PT/INR: WNL, pregnancy test negative.  Mono screen and COVID-19: Negative.  CMP shows: AST: 315, ALT: 326, bilirubin: 2.6, alkaline phosphatase: 168.  Lipase elevated at 56.  Right upper quadrant ultrasound and CT abdomen/pelvis on 2/28 resulted negative for acute findings.  GI consulted and patient admitted for further evaluation and management.  Assessment & Plan:  Elevated liver enzymes: -Viral versus autoimmune?  Patient afebrile.  CT abdomen and right upper quadrant ultrasound negative for acute findings.  Acute hepatitis panel: Negative.  COVID-19 and mono screen: Negative -Liver enzymes improving AST: 315--> 221, ALT: 326--> 3, alkaline phosphatase: 168--> 121, total bilirubin: 2.6--> 2.3 -Continue gentle hydration, morphine as needed, Zofran as needed and PPI IV -GI consulted-await recommendations  GERD: Continue IV PPI  Hx of Asthma: Stable.  No wheezing noted on exam.  On room air.   Depression: On Wellbutrin at home-discussed with pharmacy we will hold for now due to hepatotoxicity.  Obesity with BMI of 31: -Diet modification/exercise and weight loss  recommended  DVT prophylaxis: SCD  Code Status: Full code  Family Communication: Patient's mom present at bedside.  Plan of care discussed with patient in length and she verbalized understanding and agreed with it. Disposition Plan: TBD  Consultants:   GI  Procedures:  None Antimicrobials:   None   Status is: Observation   Dispo: The patient is from: Home              Anticipated d/c is to: Home              Patient currently is not medically stable to d/c.   Difficult to place patient No    Subjective: Patient seen & examined-Mom at the bedside.  Patient continues to have abdominal pain associated with nausea however denies vomiting, fever, chills, pruritus.  Tells me that she was icteric couple of days ago but now it is getting better.  No history of IV drugs, alcohol use however smokes vape.  Takes Wellbutrin for the history of depression.  History of irregular menstrual cycle-was on birth control pill which was started by her OB/GYN.  Currently she is on her day one of her cycle.  Objective: Vitals:   01/04/21 2330 01/05/21 0000 01/05/21 0047 01/05/21 0418  BP: 136/86 119/86 124/82 104/73  Pulse: 71 71 77 74  Resp:   17 18  Temp:   97.7 F (36.5 C) 98.2 F (36.8 C)  TempSrc:   Oral Oral  SpO2: 100% 99% 100% 100%  Weight:      Height:        Intake/Output Summary (Last 24 hours) at 01/05/2021 1041 Last data filed at 01/05/2021  0900 Gross per 24 hour  Intake 1069.05 ml  Output --  Net 1069.05 ml   Filed Weights   01/04/21 1706  Weight: 77.1 kg    Examination:  General exam: Appears calm and comfortable, on room air, communicating well Respiratory system: Clear to auscultation. Respiratory effort normal. Cardiovascular system: S1 & S2 heard, RRR. No JVD, murmurs, rubs, gallops or clicks. No pedal edema. Gastrointestinal system: Abdomen is soft, generalized abdominal tenderness positive, no guarding, no rigidity, bowel sounds positive Central nervous system:  Alert and oriented. No focal neurological deficits. Extremities: Symmetric 5 x 5 power. Skin: No rashes, lesions or ulcers Psychiatry: Judgement and insight appear normal. Mood & affect appropriate.    Data Reviewed: I have personally reviewed following labs and imaging studies  CBC: Recent Labs  Lab 01/02/21 1735 01/02/21 1925 01/04/21 1730  WBC 3.2*  --  5.1  NEUTROABS  --  1.5*  --   HGB 14.0  --  13.6  HCT 41.3  --  40.0  MCV 85.7  --  84.7  PLT 189  --  206   Basic Metabolic Panel: Recent Labs  Lab 01/02/21 1735 01/04/21 1730 01/05/21 0442  NA 138 134* 135  K 3.5 3.6 3.9  CL 103 98 105  CO2 24 23 18*  GLUCOSE 92 84 76  BUN 13 7 5*  CREATININE 0.76 0.72 0.84  CALCIUM 9.2 9.0 8.5*   GFR: Estimated Creatinine Clearance: 100.1 mL/min (by C-G formula based on SCr of 0.84 mg/dL). Liver Function Tests: Recent Labs  Lab 01/02/21 1735 01/04/21 1730 01/05/21 0442  AST 200* 315* 221*  ALT 179* 326* 253*  ALKPHOS 140* 168* 121  BILITOT 3.3* 2.6* 2.3*  PROT 7.5 7.8 5.7*  ALBUMIN 3.9 3.8 2.8*   Recent Labs  Lab 01/02/21 1735 01/04/21 1730  LIPASE 45 56*   No results for input(s): AMMONIA in the last 168 hours. Coagulation Profile: Recent Labs  Lab 01/04/21 2110  INR 1.0   Cardiac Enzymes: No results for input(s): CKTOTAL, CKMB, CKMBINDEX, TROPONINI in the last 168 hours. BNP (last 3 results) No results for input(s): PROBNP in the last 8760 hours. HbA1C: No results for input(s): HGBA1C in the last 72 hours. CBG: No results for input(s): GLUCAP in the last 168 hours. Lipid Profile: No results for input(s): CHOL, HDL, LDLCALC, TRIG, CHOLHDL, LDLDIRECT in the last 72 hours. Thyroid Function Tests: No results for input(s): TSH, T4TOTAL, FREET4, T3FREE, THYROIDAB in the last 72 hours. Anemia Panel: No results for input(s): VITAMINB12, FOLATE, FERRITIN, TIBC, IRON, RETICCTPCT in the last 72 hours. Sepsis Labs: No results for input(s): PROCALCITON,  LATICACIDVEN in the last 168 hours.  Recent Results (from the past 240 hour(s))  SARS CORONAVIRUS 2 (TAT 6-24 HRS) Nasopharyngeal Nasopharyngeal Swab     Status: None   Collection Time: 12/30/20  3:56 PM   Specimen: Nasopharyngeal Swab  Result Value Ref Range Status   SARS Coronavirus 2 NEGATIVE NEGATIVE Final    Comment: (NOTE) SARS-CoV-2 target nucleic acids are NOT DETECTED.  The SARS-CoV-2 RNA is generally detectable in upper and lower respiratory specimens during the acute phase of infection. Negative results do not preclude SARS-CoV-2 infection, do not rule out co-infections with other pathogens, and should not be used as the sole basis for treatment or other patient management decisions. Negative results must be combined with clinical observations, patient history, and epidemiological information. The expected result is Negative.  Fact Sheet for Patients: HairSlick.nohttps://www.fda.gov/media/138098/download  Fact Sheet for Healthcare  Providers: quierodirigir.com  This test is not yet approved or cleared by the [place] and  has been authorized for detection and/or diagnosis of SARS-CoV-2 by FDA under an Emergency Use Authorization (EUA). This EUA will remain  in effect (meaning this test can be used) for the duration of the COVID-19 declaration under Se ction 564(b)(1) of the Act, 21 U.S.C. section 360bbb-3(b)(1), unless the authorization is terminated or revoked sooner.  Performed at [facility], [address], [place], [place] [address]   Rapid Influenza A&B Antigens     Status: None   Collection Time: 12/30/20  3:56 PM   Specimen: Nasopharyngeal Swab; Respiratory  Result Value Ref Range Status   Influenza A ([facility]) NEGATIVE NEGATIVE Final   Influenza B ([facility]) NEGATIVE NEGATIVE Final    Comment: Negative results do not exclude influenza virus infection, and influenza should still be considered if clinical suspicion is high. Performed  at [facility], [address], [place], [place] [address]   Resp Panel by RT-PCR (Flu A&B, Covid) Nasopharyngeal Swab     Status: None   Collection Time: 01/04/21  9:33 PM   Specimen: Nasopharyngeal Swab; Nasopharyngeal(NP) swabs in vial transport medium  Result Value Ref Range Status   SARS Coronavirus 2 by RT PCR NEGATIVE NEGATIVE Final    Comment: (NOTE) SARS-CoV-2 target nucleic acids are NOT DETECTED.  The SARS-CoV-2 RNA is generally detectable in upper respiratory specimens during the acute phase of infection. The lowest concentration of SARS-CoV-2 viral copies this assay can detect is 138 copies/mL. A negative result does not preclude SARS-Cov-2 infection and should not be used as the sole basis for treatment or other patient management decisions. A negative result may occur with  improper specimen collection/handling, submission of specimen other than nasopharyngeal swab, presence of viral mutation(s) within the areas targeted by this assay, and inadequate number of viral copies(<138 copies/mL). A negative result must be combined with clinical observations, patient history, and epidemiological information. The expected result is Negative.  Fact Sheet for Patients:  BloggerCourse.com  Fact Sheet for Healthcare Providers:  SeriousBroker.it  This test is no t yet approved or cleared by the [place] FDA and  has been authorized for detection and/or diagnosis of SARS-CoV-2 by FDA under an Emergency Use Authorization (EUA). This EUA will remain  in effect (meaning this test can be used) for the duration of the COVID-19 declaration under Section 564(b)(1) of the Act, 21 U.S.C.section 360bbb-3(b)(1), unless the authorization is terminated  or revoked sooner.       Influenza A by PCR NEGATIVE NEGATIVE Final   Influenza B by PCR NEGATIVE NEGATIVE Final    Comment: (NOTE) The Xpert Xpress SARS-CoV-2/FLU/RSV  plus assay is intended as an aid in the diagnosis of influenza from Nasopharyngeal swab specimens and should not be used as a sole basis for treatment. Nasal washings and aspirates are unacceptable for Xpert Xpress SARS-CoV-2/FLU/RSV testing.  Fact Sheet for Patients: BloggerCourse.com  Fact Sheet for Healthcare Providers: SeriousBroker.it  This test is not yet approved or cleared by the [place] FDA and has been authorized for detection and/or diagnosis of SARS-CoV-2 by FDA under an Emergency Use Authorization (EUA). This EUA will remain in effect (meaning this test can be used) for the duration of the COVID-19 declaration under Section 564(b)(1) of the Act, 21 U.S.C. section 360bbb-3(b)(1), unless the authorization is terminated or revoked.  Performed at [facility], [address], [place], [place] [address]  Radiology Studies: No results found.  Scheduled Meds: . pantoprazole (PROTONIX) IV  40 mg Intravenous Q24H   Continuous Infusions: . lactated ringers with kcl 100 mL/hr at 01/05/21 0407     LOS: 0 days   Time spent: 35 minutes   Giovanna Kemmerer Estill Cotta, MD [facility]  If 7PM-7AM, please contact night-coverage [email or web address] 01/05/2021, 10:41 AM

## 2021-01-05 NOTE — Progress Notes (Addendum)
Pt pain and burping increased as the day went on.  Pt pain level 8-9/10 when morphine wears off. With morphine it can go down to 3-4/10.  In the morning, pain was mainly bilaterally below umbilicus, with shooting pains up both sides with movement.  In the afternoon, the pain moved higher to above the umbilicus and up midline to her sternum, with shooting intermittent shooting pains up the sides still.  Burping increased with increase in nausea mid afternoon. Charge Nurse, Mardella Layman, answered call bell and got orders for phenergan. That helped relieve nausea and burping. Pt is passing a little gas out her bottom.  GI came to see pt shortly after morphine was administered. (See GI note)  Pt pain level continues to be high, but mother reminded pt of her issues with constipation. Notified Dr. Jacqulyn Bath of pt concerns about not having had a BM since 02/26. Dr. Jacqulyn Bath ordered Miralax.  Pt also trying to go longer periods of time between morphine doses, because she knows that can slow the bowels down. If she is sleeping, she would like to be able to continue sleeping.  Pt continues to have burping episodes but has not vomited.  Ice chips are going well. When pt tries sips of water, it is still burning down the center of her chest. She is slowly going through ice chips and chewing gum every now and them.  Pt and parent understanding of pt health/plan of care is that with time, management of pain, and fluids that pt will slowly improve.

## 2021-01-05 NOTE — Progress Notes (Signed)
Pt sleeping comfortably.

## 2021-01-05 NOTE — H&P (Signed)
History and Physical    Tracy Yoder [id number]:[id number] DOB: [birth year] DOA: 01/04/2021  PCP: Ginette Otto, [facility]   Patient coming from: [facility].  I have personally briefly reviewed patient's old medical records in [facility]  Chief Complaint: Abdominal pain.  HPI: Tracy Yoder is a 25 y.o. female with medical history significant of asthma, GERD, who is coming from [facility] where she presented with a 1 week history of abdominal pain associated initially with nausea and multiple episodes of emesis in the first few days, but none since Monday.  The patient also stated that she has been feeling bloated and constipated since Saturday.  She went to the urgent care on 12/30/2020 and then went to the ED on 01/02/2021.  She has been taking omeprazole for her GERD.  She was given Toradol without significant relief for her symptoms.  She states that her abdominal pain has gotten worse, particularly with food ingestion.  She denies melena or hematochezia.  No dysuria, frequency or hematuria.  Fever, chills, rhinorrhea, sore throat, dyspnea, wheezing or hemoptysis.  No chest pain, palpitations, diaphoresis, PND, orthopnea or pitting edema of the lower extremities.  No polyuria, polydipsia, polyphagia or blurred vision.  ED Course: Initial vital signs were temperature 98.1 F, pulse 88, respiration 19, BP 126/84 mmHg and O2 sat 100% on room air.  The patient received ondansetron 4 mg IVP, morphine 2 mg IVP x2 and a 1000 mL NS bolus.  Labwork: Urinalysis shows small hemoglobinuria, large bilirubin and more than 80 mg/dL of ketones.  Rare bacteria on microscopic examination.  CBC was normal.  Unremarkable PT and INR.  Urine pregnancy test was negative.  Mono screen and coronavirus PCR negative.  Lipase slightly elevated at 56 units/L.  Imaging: RUQ ultrasound done on 01/02/2021 was negative.  CT abdomen/pelvis with contrast done on the same date did not show any acute  intra-abdominal or pelvic pathology.  Please see images in full radiology report for further detail.  Review of Systems: As per HPI otherwise all other systems reviewed and are negative.  Past Medical History:  Diagnosis Date  . Asthma   . GERD (gastroesophageal reflux disease)     Past Surgical History:  Procedure Laterality Date  . ADENOIDECTOMY    . APPENDECTOMY    . BREAST REDUCTION SURGERY Bilateral 03/06/2018   Procedure: BILATERAL MAMMARY REDUCTION  (BREAST);  Surgeon: Peggye Form, DO;  Location: [facility];  Service: Plastics;  Laterality: Bilateral;  . MASS EXCISION Left 10/30/2018   Procedure: EXCISION LEFT BREAST FAT NECROSIS;  Surgeon: Peggye Form, DO;  Location: [facility];  Service: Plastics;  Laterality: Left;  . TONSILLECTOMY AND ADENOIDECTOMY      Social History  reports that she has never smoked. She has never used smokeless tobacco. She reports current alcohol use. She reports that she does not use drugs.  No Known Allergies  Family History  Problem Relation Age of Onset  . Urolithiasis Mother   . Urolithiasis Father   . Diabetes Mellitus II Maternal Grandmother   . Diabetes Mellitus II Maternal Grandfather    Prior to Admission medications   Medication Sig Start Date End Date Taking? Authorizing Provider  buPROPion (WELLBUTRIN XL) 150 MG 24 hr tablet bupropion HCl XL 150 mg 24 hr tablet, extended release  TAKE 1 TABLET EVERY DAY BY MOUTH    [provider]  ELINEST 0.3-30 MG-MCG tablet Take 1 tablet by mouth daily.  01/02/21   [provider]  Randa Ngo 0.25-35 MG-MCG tablet Take 1 tablet by mouth daily. 10/20/20   [provider]  ketorolac (TORADOL) 10 MG tablet Take 1 tablet (10 mg total) by mouth every 6 (six) hours as needed for moderate pain or severe pain. 12/30/20   Tommie Sams, DO  omeprazole (PRILOSEC) 20 MG capsule Take 1 capsule (20 mg total) by mouth 2 (two) times daily  before a meal for 10 days. 01/02/21 01/12/21  Jeannie Fend, PA-C  ondansetron (ZOFRAN ODT) 4 MG disintegrating tablet Take 1 tablet (4 mg total) by mouth every 8 (eight) hours as needed for nausea or vomiting. 01/02/21   Jeannie Fend, PA-C  levonorgestrel (MIRENA) 20 MCG/24HR IUD 1 each by Intrauterine route once.  12/30/20  [provider]    Physical Exam: Vitals:   01/04/21 2330 01/05/21 0000 01/05/21 0047 01/05/21 0418  BP: 136/86 119/86 124/82 104/73  Pulse: 71 71 77 74  Resp:   17 18  Temp:   97.7 F (36.5 C) 98.2 F (36.8 C)  TempSrc:   Oral Oral  SpO2: 100% 99% 100% 100%  Weight:      Height:        Constitutional: NAD, calm, comfortable Eyes: PERRL, lids and conjunctivae normal.  Icteric sclerae. ENMT: Mucous membranes are dry. Posterior pharynx clear of any exudate or lesions. Neck: normal, supple, no masses, no thyromegaly Respiratory: clear to auscultation bilaterally, no wheezing, no crackles. Normal respiratory effort. No accessory muscle use.  Cardiovascular: Regular rate and rhythm, no murmurs / rubs / gallops. No extremity edema. 2+ pedal pulses. No carotid bruits.  Abdomen: No distention.  Positive bowel sounds.  Soft, positive epigastric, RUQ, RLQ and LLQ tenderness, no guarding or rebound, masses palpated. No hepatosplenomegaly. Bowel sounds positive.  Musculoskeletal: no clubbing / cyanosis. Good ROM, no contractures. Normal muscle tone.  Skin: no rashes, lesions, ulcers on limited dermatological examination Neurologic: CN 2-12 grossly intact. Sensation intact, DTR normal. Strength 5/5 in all 4.  Psychiatric: Normal judgment and insight. Alert and oriented x 3. Normal mood.   Labs on Admission: I have personally reviewed following labs and imaging studies  CBC: Recent Labs  Lab 01/02/21 1735 01/02/21 1925 01/04/21 1730  WBC 3.2*  --  5.1  NEUTROABS  --  1.5*  --   HGB 14.0  --  13.6  HCT 41.3  --  40.0  MCV 85.7  --  84.7  PLT 189  --  206     Basic Metabolic Panel: Recent Labs  Lab 01/02/21 1735 01/04/21 1730  NA 138 134*  K 3.5 3.6  CL 103 98  CO2 24 23  GLUCOSE 92 84  BUN 13 7  CREATININE 0.76 0.72  CALCIUM 9.2 9.0    GFR: Estimated Creatinine Clearance: 105.1 mL/min (by C-G formula based on SCr of 0.72 mg/dL).  Liver Function Tests: Recent Labs  Lab 01/02/21 1735 01/04/21 1730  AST 200* 315*  ALT 179* 326*  ALKPHOS 140* 168*  BILITOT 3.3* 2.6*  PROT 7.5 7.8  ALBUMIN 3.9 3.8    Urine analysis:    Component Value Date/Time   COLORURINE YELLOW 01/04/2021 1730   APPEARANCEUR CLEAR 01/04/2021 1730   LABSPEC 1.020 01/04/2021 1730   PHURINE 6.0 01/04/2021 1730   GLUCOSEU NEGATIVE 01/04/2021 1730   HGBUR SMALL (A) 01/04/2021 1730   BILIRUBINUR LARGE (A) 01/04/2021 1730   KETONESUR >80 (A) 01/04/2021 1730   PROTEINUR NEGATIVE 01/04/2021 1730  NITRITE NEGATIVE 01/04/2021 1730   LEUKOCYTESUR NEGATIVE 01/04/2021 1730    Radiological Exams on Admission: No results found.  EKG: Independently reviewed.   Assessment/Plan Principal Problem:   Transaminitis Observation/MedSurg. Keep NPO. Continue IV fluids. Analgesics as needed. Antiemetics as needed. Pantoprazole IV every 24 hours. GI consult in the morning.  Active Problems:   GERD (gastroesophageal reflux disease) Protonix 40 mg IVP every 24 hours.    Asthma Supplemental oxygen as needed. Bronchodilators as needed.    DVT prophylaxis: SCDs. Code Status:   Full code. Family Communication: Disposition Plan:   Patient is from:  Home.  Anticipated DC to:  Home.  Anticipated DC date:  01/06/2021.  Anticipated DC barriers: Clinical status/GI sign off.  Consults called: Admission status:  Observation/MedSurg.   Severity of Illness: The patient will need to stay in the hospital for symptoms management and further evaluation by gastroenterology.  She is unable to tolerate p.o. intake at this time and is requiring IV medications  to control her pain and nausea.  Tracy Mo MD [facility]  How to contact the [facility] Attending or Consulting provider 7A - 7P or covering provider during after hours 7P -7A, for this patient?   1. Check the care team in [facility] and look for a) attending/consulting [facility] provider listed and b) the [facility] team listed 2. Log into [email or web address] and use [place]'s universal password to access. If you do not have the password, please contact the hospital operator. 3. Locate the [facility] provider you are looking for under [facility] and page to a number that you can be directly reached. 4. If you still have difficulty reaching the provider, please page the [facility] (Director on Call) for the Hospitalists listed on amion for assistance.  01/05/2021, 5:03 AM   This document was prepared using Tax adviser and may contain some unintended transcription errors.

## 2021-01-06 LAB — CBC
HCT: 34.6 % — ABNORMAL LOW (ref 36.0–46.0)
Hemoglobin: 11.5 g/dL — ABNORMAL LOW (ref 12.0–15.0)
MCH: 28.7 pg (ref 26.0–34.0)
MCHC: 33.2 g/dL (ref 30.0–36.0)
MCV: 86.3 fL (ref 80.0–100.0)
Platelets: 213 10*3/uL (ref 150–400)
RBC: 4.01 MIL/uL (ref 3.87–5.11)
RDW: 13.3 % (ref 11.5–15.5)
WBC: 4.6 10*3/uL (ref 4.0–10.5)
nRBC: 0 % (ref 0.0–0.2)

## 2021-01-06 LAB — COMPREHENSIVE METABOLIC PANEL
ALT: 269 U/L — ABNORMAL HIGH (ref 0–44)
AST: 196 U/L — ABNORMAL HIGH (ref 15–41)
Albumin: 2.8 g/dL — ABNORMAL LOW (ref 3.5–5.0)
Alkaline Phosphatase: 131 U/L — ABNORMAL HIGH (ref 38–126)
Anion gap: 11 (ref 5–15)
BUN: 6 mg/dL (ref 6–20)
CO2: 21 mmol/L — ABNORMAL LOW (ref 22–32)
Calcium: 9 mg/dL (ref 8.9–10.3)
Chloride: 103 mmol/L (ref 98–111)
Creatinine, Ser: 0.83 mg/dL (ref 0.44–1.00)
GFR, Estimated: >60 mL/min (ref >60)
Glucose, Bld: 77 mg/dL (ref 70–99)
Potassium: 4.4 mmol/L (ref 3.5–5.1)
Sodium: 135 mmol/L (ref 135–145)
Total Bilirubin: 2.3 mg/dL — ABNORMAL HIGH (ref 0.3–1.2)
Total Protein: 6 g/dL — ABNORMAL LOW (ref 6.5–8.1)

## 2021-01-06 LAB — ANTI-SMOOTH MUSCLE ANTIBODY, IGG: F-Actin IgG: 7 Units (ref 0–19)

## 2021-01-06 LAB — GLUCOSE, CAPILLARY: Glucose-Capillary: 98 mg/dL (ref 70–99)

## 2021-01-06 LAB — MITOCHONDRIAL ANTIBODIES: Mitochondrial M2 Ab, IgG: <20 Units (ref 0.0–20.0)

## 2021-01-06 MED ORDER — LACTATED RINGERS IV SOLN: INTRAVENOUS | Status: DC

## 2021-01-06 NOTE — Progress Notes (Signed)
[facility] Gastroenterology Progress Note  Tracy Yoder 25 y.o. 1995/12/29   Subjective: Feels a lot better today with less abdominal pain. Passing flatus but no BM. Able to keep Miralax down after her Sucralfate dose. Mother at bedside. Nurse in room.  Objective: Vital signs: Vitals:   01/05/21 1452 01/06/21 0547  BP: 119/79 109/78  Pulse: 68 72  Resp: 17 18  Temp: 97.8 F (36.6 C) 98.1 F (36.7 C)  SpO2: 100% 100%    Physical Exam: Gen: alert, no acute distress, pleasant, well-nourished HEENT: anicteric sclera CV: RRR Chest: CTA B Abd: RUQ tenderness with less guarding, otherwise nontender, soft, nondistended, +BS Ext: no edema  Lab Results: Recent Labs    01/05/21 0442 01/06/21 0416  NA 135 135  K 3.9 4.4  CL 105 103  CO2 18* 21*  GLUCOSE 76 77  BUN 5* 6  CREATININE 0.84 0.83  CALCIUM 8.5* 9.0   Recent Labs    01/05/21 0442 01/06/21 0416  AST 221* 196*  ALT 253* 269*  ALKPHOS 121 131*  BILITOT 2.3* 2.3*  PROT 5.7* 6.0*  ALBUMIN 2.8* 2.8*   Recent Labs    01/04/21 1730 01/06/21 0416  WBC 5.1 4.6  HGB 13.6 11.5*  HCT 40.0 34.6*  MCV 84.7 86.3  PLT 206 213      Assessment/Plan: Abdominal pain likely multifactorial but mainly from her constipation (last BM 6 days ago when she normally goes daily or every other day) and also on her menstrual cycle which could contribute to her abd pain. Heartburn on PPI 40 mg IV Q 12 hours. Despite the constipation will keep on Sucralfate for now. Continue Miralax. Avoid Morphine and other narcotic pain meds. LFTs without significant change and suspect meds related (see consult note). Ferritin within normal limits. Autoimmune markers pending. Agree with clear liquid diet. Supportive care. Dr. Matthias Hughs will f/u tomorrow.   Shirley Friar 01/06/2021, 11:15 AM  Questions please call [phone] ID: Tracy Yoder, female   DOB: Aug 28, 1996, 25 y.o.   MRN: [id number]

## 2021-01-06 NOTE — Progress Notes (Addendum)
PROGRESS NOTE    AMANDALEE Yoder  WUJ:[id number] DOB: 09/06/1996 DOA: 01/04/2021 PCP: Ginette Otto, [facility]   Brief Narrative:  Tracy Yoder is a 25 y.o. female with medical history significant of asthma, GERD, who is coming from [facility] where she presented with a 1 week history of abdominal pain associated initially with nausea and multiple episodes of emesis in the first few days, but none since Monday.  The patient also stated that she has been feeling bloated and constipated since Saturday.  She went to the urgent care on 12/30/2020 and then went to the ED on 01/02/2021. No history of smoking, alcohol, IV drug abuse.  ED course: Vital signs stable, receive IV fluid, morphine and Zofran in ED.  UA positive for hemoglobin, bilirubin and ketones.  Afebrile with no leukocytosis.  PT/INR: WNL, pregnancy test negative.  Mono screen and COVID-19: Negative.  CMP shows: AST: 315, ALT: 326, bilirubin: 2.6, alkaline phosphatase: 168.  Lipase elevated at 56.  Right upper quadrant ultrasound and CT abdomen/pelvis on 2/28 resulted negative for acute findings.  GI consulted and patient admitted for further evaluation and management.  Assessment & Plan:  Elevated liver enzymes: -Unknown etiology?  Patient afebrile.  CT abdomen and right upper quadrant ultrasound negative for acute findings.  Acute hepatitis panel: Negative.  COVID-19 and mono screen: Negative -Liver enzymes: still high but going down. AST: 315--> 221-->196, ALT: 326-->253-->269, alkaline phosphatase: 168--> 121-->131, total bilirubin: 2.6--> 2.3 -Iron studies: WNL, autoimmune panel: Pending.  Agree with GI-doubt its hemochromatosis or autoimmune related.  Appreciate GI recommendations -Continue gentle hydration, morphine as needed, Zofran & phenergan as needed -Continue IV PPI twice daily and Carafate -Advance diet to clear liquid  Normocytic anemia: -H&H dropped from 13.6-11.5.  Likely  hemodilution -Monitor closely.  GERD: Continue IV PPI and Carafate  Hx of Asthma: Stable.  No wheezing noted on exam.  On room air.   Depression: On Wellbutrin at home-discussed with pharmacy we will hold for now due to hepatotoxicity.  Constipation: Continue MiraLAX.  She is passing gas but have not had any bowel movement yet  Obesity with BMI of 31: -Diet modification/exercise and weight loss recommended  DVT prophylaxis: SCD  Code Status: Full code  Family Communication: Patient's mom present at bedside.  Plan of care discussed with patient in length and she verbalized understanding and agreed with it. Disposition Plan: TBD  Consultants:   GI  Procedures:  None Antimicrobials:   None   Status is: Observation   Dispo: The patient is from: Home              Anticipated d/c is to: Home              Patient currently is not medically stable to d/c.   Difficult to place patient No    Subjective: Patient seen and examined.  Mom at the bedside.  Patient reports improvement in pain and nausea.  Remained afebrile.  No acute events overnight.  Tells me that Phenergan helped with nausea and she slept well.  Objective: Vitals:   01/05/21 0047 01/05/21 0418 01/05/21 1452 01/06/21 0547  BP: 124/82 104/73 119/79 109/78  Pulse: 77 74 68 72  Resp: 17 18 17 18   Temp: 97.7 F (36.5 C) 98.2 F (36.8 C) 97.8 F (36.6 C) 98.1 F (36.7 C)  TempSrc: Oral Oral  Oral  SpO2: 100% 100% 100% 100%  Weight:      Height:  Intake/Output Summary (Last 24 hours) at 01/06/2021 1115 Last data filed at 01/06/2021 0800 Gross per 24 hour  Intake 1180.03 ml  Output --  Net 1180.03 ml   Filed Weights   01/04/21 1706  Weight: 77.1 kg    Examination:  General exam: Appears calm and comfortable, on room air, communicating well Respiratory system: Clear to auscultation. Respiratory effort normal. Cardiovascular system: S1 & S2 heard, RRR. No JVD, murmurs, rubs, gallops or clicks. No  pedal edema. Gastrointestinal system: Abdomen is soft, mild generalized abdominal tenderness positive, no guarding, no rigidity, bowel sounds positive Central nervous system: Alert and oriented. No focal neurological deficits. Extremities: Symmetric 5 x 5 power. Skin: No rashes, lesions or ulcers Psychiatry: Judgement and insight appear normal. Mood & affect appropriate.    Data Reviewed: I have personally reviewed following labs and imaging studies  CBC: Recent Labs  Lab 01/02/21 1735 01/02/21 1925 01/04/21 1730 01/06/21 0416  WBC 3.2*  --  5.1 4.6  NEUTROABS  --  1.5*  --   --   HGB 14.0  --  13.6 11.5*  HCT 41.3  --  40.0 34.6*  MCV 85.7  --  84.7 86.3  PLT 189  --  206 213   Basic Metabolic Panel: Recent Labs  Lab 01/02/21 1735 01/04/21 1730 01/05/21 0442 01/06/21 0416  NA 138 134* 135 135  K 3.5 3.6 3.9 4.4  CL 103 98 105 103  CO2 24 23 18* 21*  GLUCOSE 92 84 76 77  BUN 13 7 5* 6  CREATININE 0.76 0.72 0.84 0.83  CALCIUM 9.2 9.0 8.5* 9.0   GFR: Estimated Creatinine Clearance: 101.3 mL/min (by C-G formula based on SCr of 0.83 mg/dL). Liver Function Tests: Recent Labs  Lab 01/02/21 1735 01/04/21 1730 01/05/21 0442 01/06/21 0416  AST 200* 315* 221* 196*  ALT 179* 326* 253* 269*  ALKPHOS 140* 168* 121 131*  BILITOT 3.3* 2.6* 2.3* 2.3*  PROT 7.5 7.8 5.7* 6.0*  ALBUMIN 3.9 3.8 2.8* 2.8*   Recent Labs  Lab 01/02/21 1735 01/04/21 1730  LIPASE 45 56*   No results for input(s): AMMONIA in the last 168 hours. Coagulation Profile: Recent Labs  Lab 01/04/21 2110  INR 1.0   Cardiac Enzymes: No results for input(s): CKTOTAL, CKMB, CKMBINDEX, TROPONINI in the last 168 hours. BNP (last 3 results) No results for input(s): PROBNP in the last 8760 hours. HbA1C: No results for input(s): HGBA1C in the last 72 hours. CBG: No results for input(s): GLUCAP in the last 168 hours. Lipid Profile: No results for input(s): CHOL, HDL, LDLCALC, TRIG, CHOLHDL,  LDLDIRECT in the last 72 hours. Thyroid Function Tests: No results for input(s): TSH, T4TOTAL, FREET4, T3FREE, THYROIDAB in the last 72 hours. Anemia Panel: Recent Labs    01/05/21 1720  FERRITIN 260  TIBC 487*  IRON 57   Sepsis Labs: No results for input(s): PROCALCITON, LATICACIDVEN in the last 168 hours.  Recent Results (from the past 240 hour(s))  SARS CORONAVIRUS 2 (TAT 6-24 HRS) Nasopharyngeal Nasopharyngeal Swab     Status: None   Collection Time: 12/30/20  3:56 PM   Specimen: Nasopharyngeal Swab  Result Value Ref Range Status   SARS Coronavirus 2 NEGATIVE NEGATIVE Final    Comment: (NOTE) SARS-CoV-2 target nucleic acids are NOT DETECTED.  The SARS-CoV-2 RNA is generally detectable in upper and lower respiratory specimens during the acute phase of infection. Negative results do not preclude SARS-CoV-2 infection, do not rule out co-infections with other pathogens,  and should not be used as the sole basis for treatment or other patient management decisions. Negative results must be combined with clinical observations, patient history, and epidemiological information. The expected result is Negative.  Fact Sheet for Patients: HairSlick.nohttps://www.fda.gov/media/138098/download  Fact Sheet for Healthcare Providers: quierodirigir.comhttps://www.fda.gov/media/138095/download  This test is not yet approved or cleared by the [place] FDA and  has been authorized for detection and/or diagnosis of SARS-CoV-2 by FDA under an Emergency Use Authorization (EUA). This EUA will remain  in effect (meaning this test can be used) for the duration of the COVID-19 declaration under Se ction 564(b)(1) of the Act, 21 U.S.C. section 360bbb-3(b)(1), unless the authorization is terminated or revoked sooner.  Performed at [facility], [address] [place], [place] [address]   Rapid Influenza A&B Antigens     Status: None   Collection Time: 12/30/20  3:56 PM   Specimen: Nasopharyngeal Swab;  Respiratory  Result Value Ref Range Status   Influenza A ([facility]) NEGATIVE NEGATIVE Final   Influenza B ([facility]) NEGATIVE NEGATIVE Final    Comment: Negative results do not exclude influenza virus infection, and influenza should still be considered if clinical suspicion is high. Performed at [facility], [address] [place], [place] [address]   Resp Panel by RT-PCR (Flu A&B, Covid) Nasopharyngeal Swab     Status: None   Collection Time: 01/04/21  9:33 PM   Specimen: Nasopharyngeal Swab; Nasopharyngeal(NP) swabs in vial transport medium  Result Value Ref Range Status   SARS Coronavirus 2 by RT PCR NEGATIVE NEGATIVE Final    Comment: (NOTE) SARS-CoV-2 target nucleic acids are NOT DETECTED.  The SARS-CoV-2 RNA is generally detectable in upper respiratory specimens during the acute phase of infection. The lowest concentration of SARS-CoV-2 viral copies this assay can detect is 138 copies/mL. A negative result does not preclude SARS-Cov-2 infection and should not be used as the sole basis for treatment or other patient management decisions. A negative result may occur with  improper specimen collection/handling, submission of specimen other than nasopharyngeal swab, presence of viral mutation(s) within the areas targeted by this assay, and inadequate number of viral copies(<138 copies/mL). A negative result must be combined with clinical observations, patient history, and epidemiological information. The expected result is Negative.  Fact Sheet for Patients:  BloggerCourse.comhttps://www.fda.gov/media/152166/download  Fact Sheet for Healthcare Providers:  SeriousBroker.ithttps://www.fda.gov/media/152162/download  This test is no t yet approved or cleared by the [place] FDA and  has been authorized for detection and/or diagnosis of SARS-CoV-2 by FDA under an Emergency Use Authorization (EUA). This EUA will remain  in effect (meaning this test can be used) for the duration of the COVID-19  declaration under Section 564(b)(1) of the Act, 21 U.S.C.section 360bbb-3(b)(1), unless the authorization is terminated  or revoked sooner.       Influenza A by PCR NEGATIVE NEGATIVE Final   Influenza B by PCR NEGATIVE NEGATIVE Final    Comment: (NOTE) The Xpert Xpress SARS-CoV-2/FLU/RSV plus assay is intended as an aid in the diagnosis of influenza from Nasopharyngeal swab specimens and should not be used as a sole basis for treatment. Nasal washings and aspirates are unacceptable for Xpert Xpress SARS-CoV-2/FLU/RSV testing.  Fact Sheet for Patients: BloggerCourse.comhttps://www.fda.gov/media/152166/download  Fact Sheet for Healthcare Providers: SeriousBroker.ithttps://www.fda.gov/media/152162/download  This test is not yet approved or cleared by the [place] FDA and has been authorized for detection and/or diagnosis of SARS-CoV-2 by FDA under an Emergency Use Authorization (EUA). This EUA will remain in effect (  meaning this test can be used) for the duration of the COVID-19 declaration under Section 564(b)(1) of the Act, 21 U.S.C. section 360bbb-3(b)(1), unless the authorization is terminated or revoked.  Performed at [facility], [address], [place], [place] [address]       Radiology Studies: No results found.  Scheduled Meds: . pantoprazole (PROTONIX) IV  40 mg Intravenous Q12H  . polyethylene glycol  17 g Oral Daily  . sucralfate  1 g Oral TID WC & HS   Continuous Infusions: . lactated ringers with kcl 100 mL/hr at 01/06/21 0412     LOS: 1 day   Time spent: 35 minutes   Afra Tricarico Estill Cotta, MD [facility]  If 7PM-7AM, please contact night-coverage [email or web address] 01/06/2021, 11:15 AM

## 2021-01-06 NOTE — Plan of Care (Signed)
  Problem: Health Behavior/Discharge Planning: Goal: Ability to manage health-related needs will improve Outcome: Progressing   Problem: Activity: Goal: Risk for activity intolerance will decrease Outcome: Progressing   Problem: Coping: Goal: Level of anxiety will decrease Outcome: Progressing   Problem: Pain Managment: Goal: General experience of comfort will improve Outcome: Progressing   

## 2021-01-07 LAB — CBC
HCT: 33.2 % — ABNORMAL LOW (ref 36.0–46.0)
Hemoglobin: 11.3 g/dL — ABNORMAL LOW (ref 12.0–15.0)
MCH: 28.8 pg (ref 26.0–34.0)
MCHC: 34 g/dL (ref 30.0–36.0)
MCV: 84.5 fL (ref 80.0–100.0)
Platelets: 254 10*3/uL (ref 150–400)
RBC: 3.93 MIL/uL (ref 3.87–5.11)
RDW: 13.3 % (ref 11.5–15.5)
WBC: 5.6 10*3/uL (ref 4.0–10.5)
nRBC: 0 % (ref 0.0–0.2)

## 2021-01-07 LAB — COMPREHENSIVE METABOLIC PANEL
ALT: 226 U/L — ABNORMAL HIGH (ref 0–44)
AST: 131 U/L — ABNORMAL HIGH (ref 15–41)
Albumin: 2.9 g/dL — ABNORMAL LOW (ref 3.5–5.0)
Alkaline Phosphatase: 138 U/L — ABNORMAL HIGH (ref 38–126)
Anion gap: 11 (ref 5–15)
BUN: <5 mg/dL — ABNORMAL LOW (ref 6–20)
CO2: 23 mmol/L (ref 22–32)
Calcium: 8.8 mg/dL — ABNORMAL LOW (ref 8.9–10.3)
Chloride: 103 mmol/L (ref 98–111)
Creatinine, Ser: 0.67 mg/dL (ref 0.44–1.00)
GFR, Estimated: >60 mL/min (ref >60)
Glucose, Bld: 91 mg/dL (ref 70–99)
Potassium: 4 mmol/L (ref 3.5–5.1)
Sodium: 137 mmol/L (ref 135–145)
Total Bilirubin: 1.3 mg/dL — ABNORMAL HIGH (ref 0.3–1.2)
Total Protein: 5.6 g/dL — ABNORMAL LOW (ref 6.5–8.1)

## 2021-01-07 MED ORDER — PROMETHAZINE HCL 12.5 MG PO TABS: 12.5000 mg | ORAL_TABLET | Freq: Four times a day (QID) | ORAL | 0 refills | Status: AC | PRN

## 2021-01-07 MED ORDER — PANTOPRAZOLE SODIUM 40 MG PO TBEC: 40.0000 mg | DELAYED_RELEASE_TABLET | Freq: Every day | ORAL | 0 refills | Status: DC

## 2021-01-07 MED ORDER — SUCRALFATE 1 GM/10ML PO SUSP: 1.0000 g | Freq: Three times a day (TID) | ORAL | 0 refills | Status: AC

## 2021-01-07 NOTE — Discharge Summary (Signed)
Physician Discharge Summary  Tracy Yoder UJW:[id number] DOB: [birth year] DOA: 01/04/2021  PCP: Ginette Otto, [facility]  Admit date: 01/04/2021 Discharge date: 01/07/2021  Admitted From: Home Disposition:  Home  Recommendations for Outpatient Follow-up:  1. Follow-up with PCP in 1 to 2 weeks 2. Repeat CBC and CMP on follow-up visit 3. Hold Wellbutrin until liver enzymes improved 4. Take Protonix 40 mg daily and Carafate as prescribed & taper it off as discussed 5. Take MiraLAX as needed for constipation 6. Avoid NSAIDs/Toradol 7. Follow-up on pending ANA results with PCP  Home Health:none  Equipment/Devices:none  Discharge Condition:stable  CODE STATUS:full code  Diet recommendation: Regular diet  Brief/Interim Summary: Tracy Yoder Ingoldis a 25 y.o.femalewith medical history significant ofasthma, GERD, who is coming from [facility] where she presented with a 1 week history of abdominal pain associated initially with nausea and multiple episodes of emesis in the first few days, but none since Monday. The patient also stated that she has been feeling bloated and constipated since Saturday. She went to the urgent care on 12/30/2020 and then went to the ED on 01/02/2021. No history of smoking, alcohol, IV drug abuse.  ED course: Vital signs stable, receive IV fluid, morphine and Zofran in ED.  UA positive for hemoglobin, bilirubin and ketones.  Afebrile with no leukocytosis.  PT/INR: WNL, pregnancy test negative.  Mono screen and COVID-19: Negative.  CMP shows: AST: 315, ALT: 326, bilirubin: 2.6, alkaline phosphatase: 168.  Lipase elevated at 56.  Right upper quadrant ultrasound and CT abdomen/pelvis on 2/28 resulted negative for acute findings.  GI consulted and patient admitted for further evaluation and management.  Elevated liver enzymes: -Drug-induced-Toradol?  Patient remained afebrile.   -CT abdomen and right upper quadrant ultrasound negative for  acute findings.  Acute hepatitis panel: Negative.  COVID-19 and mono screen: Negative -Enzymes trended down. -Iron studies: WNL, autoimmune panel: Anti-smooth antibody, antimitochondrial antibody: Negative, ANA: Pending. -Patient started on IV fluids, IV PPI twice daily, Carafate, Zofran and Phenergan as needed for nausea and vomiting.  Her symptoms improved significantly.  Her diet advanced to clear liquid to soft and then to regular-which she tolerated without any issues.  Her abdominal pain and nausea have resolved.  GI signed off and recommended repeat LFTs in 1 to 2 weeks with PCP and can follow-up with Dr. Bosie Clos if LFTs remain elevated.   Normocytic anemia: -H&H dropped from 13.6-11.5.  Likely hemodilution -Repeat CBC with PCP.  GERD: Patient started on IV PPI twice daily and Carafate. -We will discharge patient on Protonix 40 mg p.o. daily and taper it off.  Carafate 4 times daily and taper it off as per GI recommendations.    Hx of Asthma: Remained stable.  No wheezing noted on exam.  On room air.   Depression: On Wellbutrin at home-hold Wellbutrin upon discharge until liver enzymes improves.  Constipation:  Resolved with MiraLAX.  Continue MiraLAX at home as needed.  Obesity with BMI of 31: -Diet modification/exercise and weight loss recommended  Discharge Diagnoses:  Elevated liver enzymes-drug-induced Normocytic anemia GERD History of asthma Depression Constipation Obesity   Discharge Instructions  Discharge Instructions    Diet general   Complete by: As directed    Discharge instructions   Complete by: As directed    Follow-up with PCP in 1 to 2 weeks Repeat CBC and CMP on follow-up visit Hold Wellbutrin until liver enzymes improved Take Protonix 40 mg daily and Carafate as prescribed & taper it  off as discussed Take MiraLAX as needed for constipation Avoid NSAIDs/Toradol   Increase activity slowly   Complete by: As directed      Allergies as of  01/07/2021      Reactions   Dilaudid [hydromorphone] Nausea And Vomiting   Ketorolac    Causes liver to swell   Vicodin Hp [hydrocodone-acetaminophen]    Hallucinations       Medication List    STOP taking these medications   buPROPion 150 MG 24 hr tablet Commonly known as: WELLBUTRIN XL   omeprazole 20 MG capsule Commonly known as: PRILOSEC     TAKE these medications   Femynor 0.25-35 MG-MCG tablet Generic drug: norgestimate-ethinyl estradiol Take 1 tablet by mouth daily.   ondansetron 4 MG disintegrating tablet Commonly known as: Zofran ODT Take 1 tablet (4 mg total) by mouth every 8 (eight) hours as needed for nausea or vomiting.   pantoprazole 40 MG tablet Commonly known as: Protonix Take 1 tablet (40 mg total) by mouth daily.   promethazine 12.5 MG tablet Commonly known as: PHENERGAN Take 1 tablet (12.5 mg total) by mouth every 6 (six) hours as needed for nausea or vomiting.   sucralfate 1 GM/10ML suspension Commonly known as: CARAFATE Take 10 mLs (1 g total) by mouth 4 (four) times daily -  with meals and at bedtime.       Follow-up Information    [place] Follow up in 1 week(s).   Contact information: [address] [place] [address] [phone]              Allergies  Allergen Reactions  . Dilaudid [Hydromorphone] Nausea And Vomiting  . Ketorolac     Causes liver to swell  . Vicodin Hp [Hydrocodone-Acetaminophen]     Hallucinations     Consultations:  GI   Procedures/Studies: CT Abdomen Pelvis W Contrast  Result Date: 01/02/2021 CLINICAL DATA:  Body aches and legs shaking EXAM: CT ABDOMEN AND PELVIS WITH CONTRAST TECHNIQUE: Multidetector CT imaging of the abdomen and pelvis was performed using the standard protocol following bolus administration of intravenous contrast. CONTRAST:  OMNIPAQUE IOHEXOL 300 MG/ML  SOLN COMPARISON:  None. FINDINGS: Lower chest: The visualized heart size within  normal limits. No pericardial fluid/thickening. No hiatal hernia. The visualized portions of the lungs are clear. Hepatobiliary: The liver is normal in density without focal abnormality.The main portal vein is patent. No evidence of calcified gallstones, gallbladder wall thickening or biliary dilatation. Pancreas: Unremarkable. No pancreatic ductal dilatation or surrounding inflammatory changes. Spleen: Normal in size without focal abnormality. Adrenals/Urinary Tract: Both adrenal glands appear normal. The kidneys and collecting system appear normal without evidence of urinary tract calculus or hydronephrosis. Bladder is unremarkable. Stomach/Bowel: The stomach, small bowel, and colon are normal in appearance. No inflammatory changes, wall thickening, or obstructive findings.The appendix is normal. Vascular/Lymphatic: There are no enlarged mesenteric, retroperitoneal, or pelvic lymph nodes. No significant vascular findings are present. Reproductive: Small amount of fluid seen within the endometrial canal. Other: No abdominal wall hernia is noted. Musculoskeletal: No acute or significant osseous findings. IMPRESSION: No acute intra-abdominal or pelvic pathology to explain the patient's symptoms. Electronically Signed   By: Jonna Clark Yoder.D.   On: 01/02/2021 22:35   US Abdomen Limited  Result Date: 01/02/2021 CLINICAL DATA:  Elevated liver function tests EXAM: ULTRASOUND ABDOMEN LIMITED RIGHT UPPER QUADRANT COMPARISON:  None. FINDINGS: Gallbladder: No gallstones or wall thickening visualized. No sonographic Murphy sign noted by  sonographer. Common bile duct: Diameter: 4 mm Liver: No focal lesion identified. Within normal limits in parenchymal echogenicity. Portal vein is patent on color Doppler imaging with normal direction of blood flow towards the liver. Other: None. IMPRESSION: 1. Unremarkable right upper quadrant ultrasound. Electronically Signed   By: Sharlet Salina Yoder.D.   On: 01/02/2021 19:50        Subjective: Patient seen and examined.  Mom at the bedside.  Patient reports improvement in nausea, abdominal pain.  She tolerated clear liquid and then soft and then regular diet without any issues.  Wishes to go home today.  Discharge Exam: Vitals:   01/06/21 2003 01/07/21 0452  BP: 118/86 104/67  Pulse: 77 62  Resp: 18   Temp: 98.3 F (36.8 C) 98.2 F (36.8 C)  SpO2: 100% 100%   Vitals:   01/06/21 0547 01/06/21 1437 01/06/21 2003 01/07/21 0452  BP: 109/78 121/84 118/86 104/67  Pulse: 72 81 77 62  Resp: Temp: 98.1 F (36.7 C) 98 F (36.7 C) 98.3 F (36.8 C) 98.2 F (36.8 C)  TempSrc: Oral Oral Oral Oral  SpO2: 100% 99% 100% 100%  Weight:      Height:        General: Pt is alert, awake, not in acute distress, on room air, communicating well Cardiovascular: RRR, S1/S2 +, no rubs, no gallops Respiratory: CTA bilaterally, no wheezing, no rhonchi Abdominal: Soft, NT, ND, bowel sounds + Extremities: no edema, no cyanosis    The results of significant diagnostics from this hospitalization (including imaging, microbiology, ancillary and laboratory) are listed below for reference.     Microbiology: Recent Results (from the past 240 hour(s))  SARS CORONAVIRUS 2 (TAT 6-24 HRS) Nasopharyngeal Nasopharyngeal Swab     Status: None   Collection Time: 12/30/20  3:56 PM   Specimen: Nasopharyngeal Swab  Result Value Ref Range Status   SARS Coronavirus 2 NEGATIVE NEGATIVE Final    Comment: (NOTE) SARS-CoV-2 target nucleic acids are NOT DETECTED.  The SARS-CoV-2 RNA is generally detectable in upper and lower respiratory specimens during the acute phase of infection. Negative results do not preclude SARS-CoV-2 infection, do not rule out co-infections with other pathogens, and should not be used as the sole basis for treatment or other patient management decisions. Negative results must be combined with clinical observations, patient history, and  epidemiological information. The expected result is Negative.  Fact Sheet for Patients: HairSlick.no  Fact Sheet for Healthcare Providers: quierodirigir.com  This test is not yet approved or cleared by the [place] FDA and  has been authorized for detection and/or diagnosis of SARS-CoV-2 by FDA under an Emergency Use Authorization (EUA). This EUA will remain  in effect (meaning this test can be used) for the duration of the COVID-19 declaration under Se ction 564(b)(1) of the Act, 21 U.S.C. section 360bbb-3(b)(1), unless the authorization is terminated or revoked sooner.  Performed at [facility], [address] [place], [place] [address]   Rapid Influenza A&B Antigens     Status: None   Collection Time: 12/30/20  3:56 PM   Specimen: Nasopharyngeal Swab; Respiratory  Result Value Ref Range Status   Influenza A ([facility]) NEGATIVE NEGATIVE Final   Influenza B ([facility]) NEGATIVE NEGATIVE Final    Comment: Negative results do not exclude influenza virus infection, and influenza should still be considered if clinical suspicion is high. Performed at [facility], [address], [place], [place] [address]  Resp Panel by RT-PCR (Flu A&B, Covid) Nasopharyngeal Swab     Status: None   Collection Time: 01/04/21  9:33 PM   Specimen: Nasopharyngeal Swab; Nasopharyngeal(NP) swabs in vial transport medium  Result Value Ref Range Status   SARS Coronavirus 2 by RT PCR NEGATIVE NEGATIVE Final    Comment: (NOTE) SARS-CoV-2 target nucleic acids are NOT DETECTED.  The SARS-CoV-2 RNA is generally detectable in upper respiratory specimens during the acute phase of infection. The lowest concentration of SARS-CoV-2 viral copies this assay can detect is 138 copies/mL. A negative result does not preclude SARS-Cov-2 infection and should not be used as the sole basis for treatment or other patient management decisions. A negative  result may occur with  improper specimen collection/handling, submission of specimen other than nasopharyngeal swab, presence of viral mutation(s) within the areas targeted by this assay, and inadequate number of viral copies(<138 copies/mL). A negative result must be combined with clinical observations, patient history, and epidemiological information. The expected result is Negative.  Fact Sheet for Patients:  BloggerCourse.com  Fact Sheet for Healthcare Providers:  SeriousBroker.it  This test is no t yet approved or cleared by the [place] FDA and  has been authorized for detection and/or diagnosis of SARS-CoV-2 by FDA under an Emergency Use Authorization (EUA). This EUA will remain  in effect (meaning this test can be used) for the duration of the COVID-19 declaration under Section 564(b)(1) of the Act, 21 U.S.C.section 360bbb-3(b)(1), unless the authorization is terminated  or revoked sooner.       Influenza A by PCR NEGATIVE NEGATIVE Final   Influenza B by PCR NEGATIVE NEGATIVE Final    Comment: (NOTE) The Xpert Xpress SARS-CoV-2/FLU/RSV plus assay is intended as an aid in the diagnosis of influenza from Nasopharyngeal swab specimens and should not be used as a sole basis for treatment. Nasal washings and aspirates are unacceptable for Xpert Xpress SARS-CoV-2/FLU/RSV testing.  Fact Sheet for Patients: BloggerCourse.com  Fact Sheet for Healthcare Providers: SeriousBroker.it  This test is not yet approved or cleared by the [place] FDA and has been authorized for detection and/or diagnosis of SARS-CoV-2 by FDA under an Emergency Use Authorization (EUA). This EUA will remain in effect (meaning this test can be used) for the duration of the COVID-19 declaration under Section 564(b)(1) of the Act, 21 U.S.C. section 360bbb-3(b)(1), unless the authorization is  terminated or revoked.  Performed at [facility], [address] [place], [place] [address]      Labs: BNP (last 3 results) No results for input(s): BNP in the last 8760 hours. Basic Metabolic Panel: Recent Labs  Lab 01/02/21 1735 01/04/21 1730 01/05/21 0442 01/06/21 0416 01/07/21 0058  NA 138 134* 135 135 137  K 3.5 3.6 3.9 4.4 4.0  CL 103 98 105 103 103  CO2 24 23 18* 21* 23  GLUCOSE 92 84 76 77 91  BUN 13 7 5* 6 <5*  CREATININE 0.76 0.72 0.84 0.83 0.67  CALCIUM 9.2 9.0 8.5* 9.0 8.8*   Liver Function Tests: Recent Labs  Lab 01/02/21 1735 01/04/21 1730 01/05/21 0442 01/06/21 0416 01/07/21 0058  AST 200* 315* 221* 196* 131*  ALT 179* 326* 253* 269* 226*  ALKPHOS 140* 168* 121 131* 138*  BILITOT 3.3* 2.6* 2.3* 2.3* 1.3*  PROT 7.5 7.8 5.7* 6.0* 5.6*  ALBUMIN 3.9 3.8 2.8* 2.8* 2.9*   Recent Labs  Lab 01/02/21 1735 01/04/21 1730  LIPASE 45 56*   No results for input(s):  AMMONIA in the last 168 hours. CBC: Recent Labs  Lab 01/02/21 1735 01/02/21 1925 01/04/21 1730 01/06/21 0416 01/07/21 0058  WBC 3.2*  --  5.1 4.6 5.6  NEUTROABS  --  1.5*  --   --   --   HGB 14.0  --  13.6 11.5* 11.3*  HCT 41.3  --  40.0 34.6* 33.2*  MCV 85.7  --  84.7 86.3 84.5  PLT 189  --  206 213 254   Cardiac Enzymes: No results for input(s): CKTOTAL, CKMB, CKMBINDEX, TROPONINI in the last 168 hours. BNP: Invalid input(s): POCBNP CBG: Recent Labs  Lab 01/06/21 1837  GLUCAP 98   D-Dimer No results for input(s): DDIMER in the last 72 hours. Hgb A1c No results for input(s): HGBA1C in the last 72 hours. Lipid Profile No results for input(s): CHOL, HDL, LDLCALC, TRIG, CHOLHDL, LDLDIRECT in the last 72 hours. Thyroid function studies No results for input(s): TSH, T4TOTAL, T3FREE, THYROIDAB in the last 72 hours.  Invalid input(s): FREET3 Anemia work up Recent Labs    01/05/21 1720  FERRITIN 260  TIBC 487*  IRON 57   Urinalysis    Component Value  Date/Time   COLORURINE YELLOW 01/04/2021 1730   APPEARANCEUR CLEAR 01/04/2021 1730   LABSPEC 1.020 01/04/2021 1730   PHURINE 6.0 01/04/2021 1730   GLUCOSEU NEGATIVE 01/04/2021 1730   HGBUR SMALL (A) 01/04/2021 1730   BILIRUBINUR LARGE (A) 01/04/2021 1730   KETONESUR >80 (A) 01/04/2021 1730   PROTEINUR NEGATIVE 01/04/2021 1730   UROBILINOGEN 1.0 10/21/2010 1527   NITRITE NEGATIVE 01/04/2021 1730   LEUKOCYTESUR NEGATIVE 01/04/2021 1730   Sepsis Labs Invalid input(s): PROCALCITONIN,  WBC,  LACTICIDVEN Microbiology Recent Results (from the past 240 hour(s))  SARS CORONAVIRUS 2 (TAT 6-24 HRS) Nasopharyngeal Nasopharyngeal Swab     Status: None   Collection Time: 12/30/20  3:56 PM   Specimen: Nasopharyngeal Swab  Result Value Ref Range Status   SARS Coronavirus 2 NEGATIVE NEGATIVE Final    Comment: (NOTE) SARS-CoV-2 target nucleic acids are NOT DETECTED.  The SARS-CoV-2 RNA is generally detectable in upper and lower respiratory specimens during the acute phase of infection. Negative results do not preclude SARS-CoV-2 infection, do not rule out co-infections with other pathogens, and should not be used as the sole basis for treatment or other patient management decisions. Negative results must be combined with clinical observations, patient history, and epidemiological information. The expected result is Negative.  Fact Sheet for Patients: HairSlick.nohttps://www.fda.gov/media/138098/download  Fact Sheet for Healthcare Providers: quierodirigir.comhttps://www.fda.gov/media/138095/download  This test is not yet approved or cleared by the [place] FDA and  has been authorized for detection and/or diagnosis of SARS-CoV-2 by FDA under an Emergency Use Authorization (EUA). This EUA will remain  in effect (meaning this test can be used) for the duration of the COVID-19 declaration under Se ction 564(b)(1) of the Act, 21 U.S.C. section 360bbb-3(b)(1), unless the authorization is terminated or revoked  sooner.  Performed at [facility], [address] [place], [place] [address]   Rapid Influenza A&B Antigens     Status: None   Collection Time: 12/30/20  3:56 PM   Specimen: Nasopharyngeal Swab; Respiratory  Result Value Ref Range Status   Influenza A ([facility]) NEGATIVE NEGATIVE Final   Influenza B ([facility]) NEGATIVE NEGATIVE Final    Comment: Negative results do not exclude influenza virus infection, and influenza should still be considered if clinical suspicion is high. Performed at [facility], [phone] [place], [place] [address]   Resp Panel by RT-PCR (Flu A&B, Covid) Nasopharyngeal Swab     Status: None   Collection Time: 01/04/21  9:33 PM   Specimen: Nasopharyngeal Swab; Nasopharyngeal(NP) swabs in vial transport medium  Result Value Ref Range Status   SARS Coronavirus 2 by RT PCR NEGATIVE NEGATIVE Final    Comment: (NOTE) SARS-CoV-2 target nucleic acids are NOT DETECTED.  The SARS-CoV-2 RNA is generally detectable in upper respiratory specimens during the acute phase of infection. The lowest concentration of SARS-CoV-2 viral copies this assay can detect is 138 copies/mL. A negative result does not preclude SARS-Cov-2 infection and should not be used as the sole basis for treatment or other patient management decisions. A negative result may occur with  improper specimen collection/handling, submission of specimen other than nasopharyngeal swab, presence of viral mutation(s) within the areas targeted by this assay, and inadequate number of viral copies(<138 copies/mL). A negative result must be combined with clinical observations, patient history, and epidemiological information. The expected result is Negative.  Fact Sheet for Patients:  BloggerCourse.com  Fact Sheet for Healthcare Providers:  SeriousBroker.it  This test is no t yet approved or cleared by the [place] FDA and  has been  authorized for detection and/or diagnosis of SARS-CoV-2 by FDA under an Emergency Use Authorization (EUA). This EUA will remain  in effect (meaning this test can be used) for the duration of the COVID-19 declaration under Section 564(b)(1) of the Act, 21 U.S.C.section 360bbb-3(b)(1), unless the authorization is terminated  or revoked sooner.       Influenza A by PCR NEGATIVE NEGATIVE Final   Influenza B by PCR NEGATIVE NEGATIVE Final    Comment: (NOTE) The Xpert Xpress SARS-CoV-2/FLU/RSV plus assay is intended as an aid in the diagnosis of influenza from Nasopharyngeal swab specimens and should not be used as a sole basis for treatment. Nasal washings and aspirates are unacceptable for Xpert Xpress SARS-CoV-2/FLU/RSV testing.  Fact Sheet for Patients: BloggerCourse.com  Fact Sheet for Healthcare Providers: SeriousBroker.it  This test is not yet approved or cleared by the [place] FDA and has been authorized for detection and/or diagnosis of SARS-CoV-2 by FDA under an Emergency Use Authorization (EUA). This EUA will remain in effect (meaning this test can be used) for the duration of the COVID-19 declaration under Section 564(b)(1) of the Act, 21 U.S.C. section 360bbb-3(b)(1), unless the authorization is terminated or revoked.  Performed at [facility], [address] [place], [place] [address]      Time coordinating discharge: Over 30 minutes  SIGNED:   Ollen Bowl, MD  [facility] 01/07/2021, 1:27 PM Pager   If 7PM-7AM, please contact night-coverage [email or web address]

## 2021-01-07 NOTE — Progress Notes (Signed)
Pt states her stomach feels "10 times better."  Had several loose BM's yesterday and a good BM today.  Getting ready to start soft diet.  LFT's show progressive improvement, about 50% improved over past 48hrs.  Exam:  Looks great, NAD, abd soft and NT  IMPR:  1. Resolving elev of LFT's, suspect Toradol.  Less likely Wellbutrin or virus.  2. Abd pain, improved on sucralfate.  Etiol unclear  3. Constip, chronic, resolved in hosp on Miralax   RECOMM:  1.  Will sign off; call if questions.  OK for dischg if tolerates solid diet 2. recomm continuing sucralfate on dischg, BUT pt can try tapering at home over the next week if doing well (eg, from qid to tid to bid then off) 3. recomm Protonix qd for a week or so post dischg, then if doing well, can cut back to qod for several days and then d/c 4. Needs f/u w/ PCP in 1-2 wks to recheck LFT's--they could refer back to Dr. Bosie Clos if they remain elelvated.  Otherwise f/u w/ GI not needed---I don't think this pt will turn out to have any kind of chronic GI problem. 5. Pt won't need any ongoing laxatives post dischg (she uses rarely at home if needed) 6. Although Toradol is not certain as a cause for elev LFT's, for now I would list it as an allergy for this pt.  In the future, it would be best to avoid it, or, if it were essential to use it, to monitor LFT's closely.  I don't think this pt would have to avoid any other NSAID's. 7. I doubt Wellbutrin was responsible for this clinical presentation, but ff possible, I would avoid use of Wellbutrin temporarily until all this blows over; it could then be restarted (monitoring LFT's a few weeks thereafter).  An alternative anxiolytic agent could be used in the interim if felt necessary.  [name], M.D. Pager [phone] If no answer or after 5 PM call [phone]

## 2021-01-07 NOTE — Progress Notes (Signed)
Discharge instructions given to patient. Patient verbalizes understanding.

## 2021-01-09 ENCOUNTER — Other Ambulatory Visit: Payer: Self-pay | Admitting: *Deleted

## 2021-01-09 NOTE — Patient Outreach (Addendum)
[facility] Customer service manager [facility]) Care Management  01/09/2021  KESLEIGH MORSON 1996-08-14 [id number]   .Transition of care telephone call  Referral received:01/06/21 Initial outreach:01/09/21 Insurance: [employer]  Initial unsuccessful telephone call to patient's preferred number in order to complete transition of care assessment; Person answering phone identified as [place] , she reports working at this time and will return call in the next  15 minutes.  No return call from patient.  Objective: Sue Fernicola was hospitalized at [facility] for Transaminitis, elevated liver enzymes,abdominal pain 3/3-01/07/21 Comorbidities include: Anxiety, Asthma,   She was discharged to home on 01/07/21  without the need for home health services or DME.  Plan: This RNCM will route unsuccessful outreach letter with [facility] Care Management pamphlet and 24 hour Nurse Advice Line Magnet to [employer] Care Management clinical pool to be mailed to patient's home address. This RNCM will attempt another outreach within 4 business days.   Egbert Garibaldi, RN, BSN  [facility] Care Management,Care Management Coordinator  [phone]- Mobile [phone]- Toll Free Main Office

## 2021-01-10 LAB — FANA STAINING PATTERNS: Homogeneous Pattern: 1 — ABNORMAL HIGH

## 2021-01-10 LAB — ANTINUCLEAR ANTIBODIES, IFA: ANA Ab, IFA: POSITIVE — AB

## 2021-01-12 ENCOUNTER — Encounter: Payer: Self-pay | Admitting: *Deleted

## 2021-01-12 ENCOUNTER — Other Ambulatory Visit: Payer: Self-pay | Admitting: *Deleted

## 2021-01-12 NOTE — Patient Outreach (Addendum)
[facility]) Care Management  01/12/2021  Tracy Yoder Dec 26, 1995 [id number]   Transition of care call/case closure   Referral received: 01/06/21 Initial outreach:01/09/21 Insurance: [employer]   Subjective: Initial successful telephone call to patient's preferred number in order to complete transition of care assessment; 2 HIPAA identifiers verified. Explained purpose of call and completed transition of care assessment.  Tracy Yoder states that she is feeling much better.She denies abdominal pain, nausea or vomiting. She report stolerating diet eating small more frequent meals . She  denies bowel or bladder problems, using prn miralax type laxative every other day.  Parent  are assisting with her .   Reviewed accessing the following [place] Benefits :  She does not use a [facility] outpatient pharmacy.   .    Objective:  Tracy Yoder was hospitalized [facility] for Transaminitis, elevated liver enzymes,abdominal pain 3/3-01/07/21 Comorbidities include: Anxiety, Asthma,   She was discharged to home on 01/07/21  without the need for home health servicesor DME. Assessment:  Patient voices good understanding of all discharge instructions.  See transition of care flowsheet for assessment details.   Plan:  Reviewed hospital discharge diagnosis of Abdominal pain, nausea vomiting, elevated liver enzymes   and discharge treatment plan using hospital discharge instructions, assessing medication adherence, reviewing problems requiring provider notification, and discussing the importance of follow up with  primary care provider and/or specialists as directed.  .    No ongoing care management needs identified so will close case to [facility] Care Management services. She was routed [facility] outreach letter on initial call date.   Tracy Garibaldi, RN, BSN  [facility] Care Management,Care Management Coordinator  [phone]- Mobile [phone]- Toll Free  Main Office

## 2021-01-18 DIAGNOSIS — R945 Abnormal results of liver function studies: Secondary | ICD-10-CM | POA: Diagnosis not present

## 2021-01-18 DIAGNOSIS — F419 Anxiety disorder, unspecified: Secondary | ICD-10-CM | POA: Diagnosis not present

## 2021-01-20 ENCOUNTER — Other Ambulatory Visit (HOSPITAL_COMMUNITY): Payer: Self-pay | Admitting: Physician Assistant

## 2021-02-24 ENCOUNTER — Other Ambulatory Visit (HOSPITAL_COMMUNITY): Payer: Self-pay

## 2021-02-24 MED FILL — Escitalopram Oxalate Tab 10 MG (Base Equiv): ORAL | 30 days supply | Qty: 30 | Fill #0 | Status: AC

## 2021-03-07 DIAGNOSIS — Z760 Encounter for issue of repeat prescription: Secondary | ICD-10-CM | POA: Diagnosis not present

## 2021-03-23 MED FILL — Norgestrel & Ethinyl Estradiol Tab 0.3 MG-30 MCG: ORAL | 84 days supply | Qty: 84 | Fill #0 | Status: AC

## 2021-03-24 ENCOUNTER — Other Ambulatory Visit (HOSPITAL_COMMUNITY): Payer: Self-pay

## 2021-03-27 ENCOUNTER — Other Ambulatory Visit (HOSPITAL_COMMUNITY): Payer: Self-pay

## 2021-03-28 ENCOUNTER — Other Ambulatory Visit (HOSPITAL_COMMUNITY): Payer: Self-pay

## 2021-03-28 MED ORDER — ESCITALOPRAM OXALATE 10 MG PO TABS
10.0000 mg | ORAL_TABLET | Freq: Every day | ORAL | 0 refills | Status: AC
  Filled 2021-03-28: qty 30, 30d supply, fill #0

## 2021-04-19 ENCOUNTER — Other Ambulatory Visit (HOSPITAL_COMMUNITY): Payer: Self-pay

## 2021-04-19 DIAGNOSIS — F419 Anxiety disorder, unspecified: Secondary | ICD-10-CM | POA: Diagnosis not present

## 2021-04-19 MED ORDER — ESCITALOPRAM OXALATE 10 MG PO TABS
10.0000 mg | ORAL_TABLET | Freq: Every day | ORAL | 1 refills | Status: AC
  Filled 2021-04-19 – 2021-04-25 (×2): qty 90, 90d supply, fill #0

## 2021-04-25 ENCOUNTER — Other Ambulatory Visit (HOSPITAL_COMMUNITY): Payer: Self-pay

## 2021-05-29 ENCOUNTER — Other Ambulatory Visit (HOSPITAL_COMMUNITY): Payer: Self-pay

## 2021-05-29 MED FILL — Norgestrel & Ethinyl Estradiol Tab 0.3 MG-30 MCG: ORAL | 84 days supply | Qty: 84 | Fill #1 | Status: CN

## 2021-06-01 ENCOUNTER — Other Ambulatory Visit (HOSPITAL_COMMUNITY): Payer: Self-pay

## 2021-06-01 MED ORDER — CARESTART COVID-19 HOME TEST VI KIT
PACK | 0 refills | Status: AC
  Filled 2021-06-01: qty 4, 4d supply, fill #0

## 2021-06-10 ENCOUNTER — Other Ambulatory Visit: Payer: Self-pay | Admitting: Internal Medicine

## 2021-06-27 ENCOUNTER — Other Ambulatory Visit: Payer: Self-pay | Admitting: Family Medicine

## 2021-08-02 ENCOUNTER — Other Ambulatory Visit (HOSPITAL_COMMUNITY): Payer: Self-pay

## 2021-08-02 MED ORDER — ESCITALOPRAM OXALATE 10 MG PO TABS
15.0000 mg | ORAL_TABLET | Freq: Every day | ORAL | 0 refills | Status: AC
  Filled 2021-08-02: qty 135, 90d supply, fill #0

## 2021-08-08 ENCOUNTER — Other Ambulatory Visit (HOSPITAL_COMMUNITY): Payer: Self-pay

## 2021-08-08 DIAGNOSIS — F419 Anxiety disorder, unspecified: Secondary | ICD-10-CM | POA: Diagnosis not present

## 2021-09-15 DIAGNOSIS — Z113 Encounter for screening for infections with a predominantly sexual mode of transmission: Secondary | ICD-10-CM | POA: Diagnosis not present

## 2021-09-15 DIAGNOSIS — Z309 Encounter for contraceptive management, unspecified: Secondary | ICD-10-CM | POA: Diagnosis not present

## 2021-10-11 ENCOUNTER — Other Ambulatory Visit: Payer: Self-pay

## 2021-11-24 ENCOUNTER — Other Ambulatory Visit (HOSPITAL_COMMUNITY): Payer: Self-pay

## 2022-03-22 ENCOUNTER — Other Ambulatory Visit (HOSPITAL_COMMUNITY): Payer: Self-pay

## 2022-03-22 DIAGNOSIS — Z309 Encounter for contraceptive management, unspecified: Secondary | ICD-10-CM | POA: Diagnosis not present

## 2022-03-22 DIAGNOSIS — Z6834 Body mass index (BMI) 34.0-34.9, adult: Secondary | ICD-10-CM | POA: Diagnosis not present

## 2022-03-22 DIAGNOSIS — Z01419 Encounter for gynecological examination (general) (routine) without abnormal findings: Secondary | ICD-10-CM | POA: Diagnosis not present

## 2022-03-22 DIAGNOSIS — Z113 Encounter for screening for infections with a predominantly sexual mode of transmission: Secondary | ICD-10-CM | POA: Diagnosis not present

## 2022-03-22 DIAGNOSIS — F419 Anxiety disorder, unspecified: Secondary | ICD-10-CM | POA: Diagnosis not present

## 2022-03-22 DIAGNOSIS — Z124 Encounter for screening for malignant neoplasm of cervix: Secondary | ICD-10-CM | POA: Diagnosis not present

## 2022-03-22 DIAGNOSIS — N641 Fat necrosis of breast: Secondary | ICD-10-CM | POA: Diagnosis not present

## 2022-03-22 MED ORDER — DROSPIRENONE-ETHINYL ESTRADIOL 3-0.03 MG PO TABS
1.0000 | ORAL_TABLET | Freq: Every day | ORAL | 3 refills | Status: AC
  Filled 2022-03-22: qty 84, 84d supply, fill #0
  Filled 2022-12-21: qty 84, 84d supply, fill #1

## 2022-08-11 IMAGING — CT CT ABD-PELV W/ CM
2 of 4 series · 17 of 46 positions shown, 19 images · IV contrast (omnipaque)
Comparison: None.

CLINICAL DATA: Body aches and legs shaking

EXAM:
CT ABDOMEN AND PELVIS WITH CONTRAST
TECHNIQUE: Multidetector CT imaging of the abdomen and pelvis was performed
using the standard protocol following bolus administration of
intravenous contrast.
CONTRAST:  100mL OMNIPAQUE IOHEXOL 300 MG/ML  SOLN

[Series 2: axial st · axial · 0.88mm/px · z∈[-590,-194]mm · 14 of 89 slices shown, 16 images]
[im 5/89  soft-tissue]
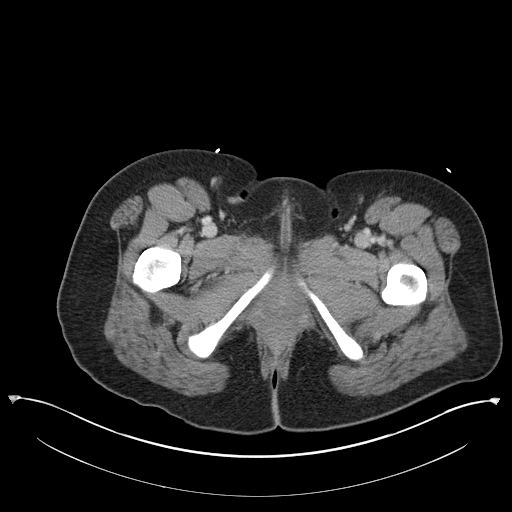
[im 5/89  bone]
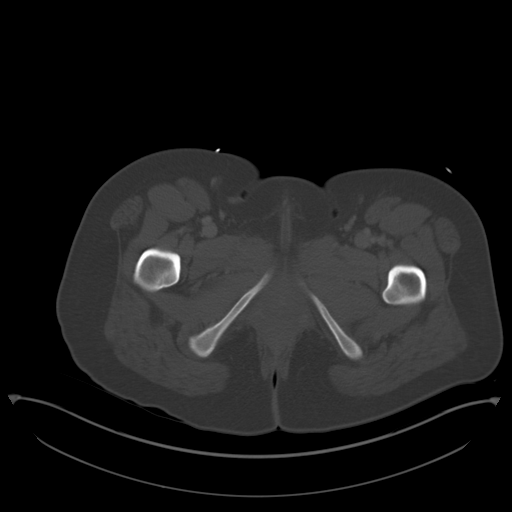
[im 10/89  soft-tissue]
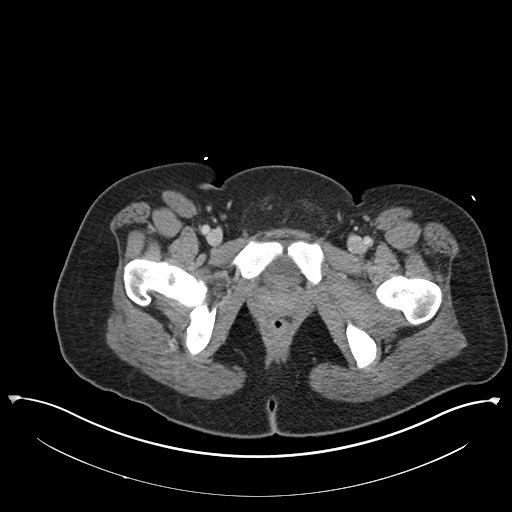
[im 20/89  soft-tissue]
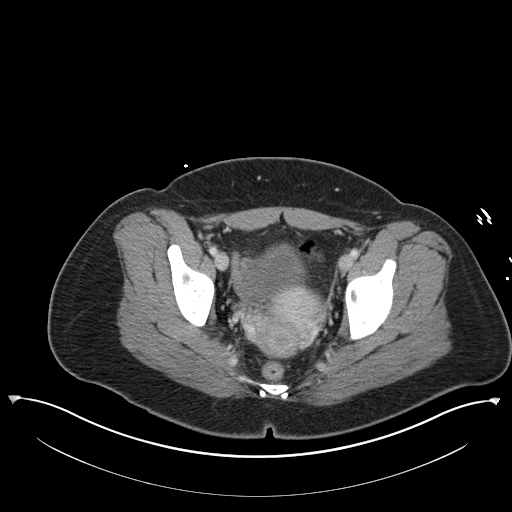
[im 25/89  soft-tissue]
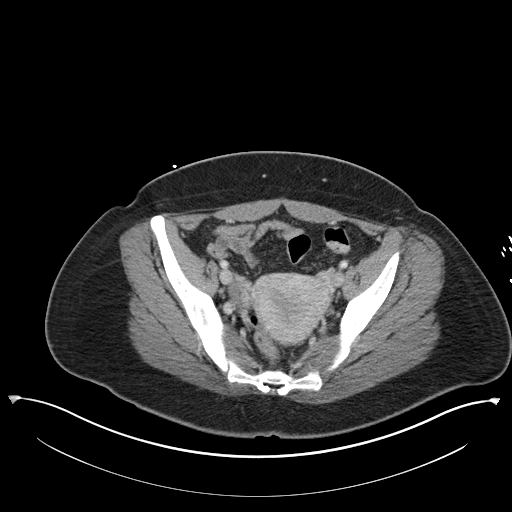
[im 30/89  soft-tissue]
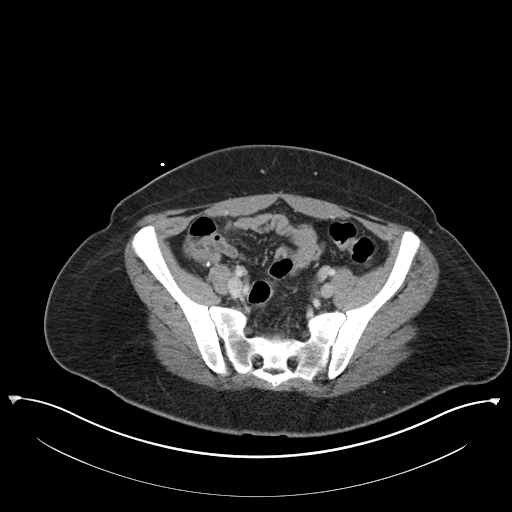
[im 35/89  soft-tissue]
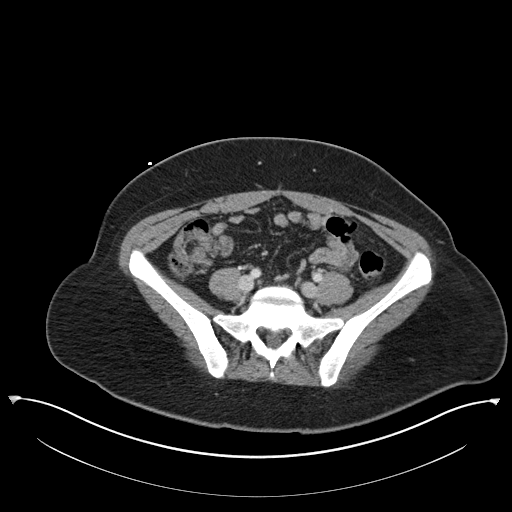
[im 40/89  soft-tissue]
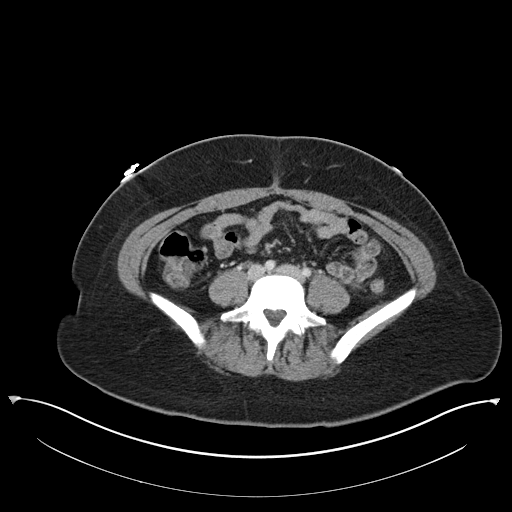
[im 49/89  soft-tissue]
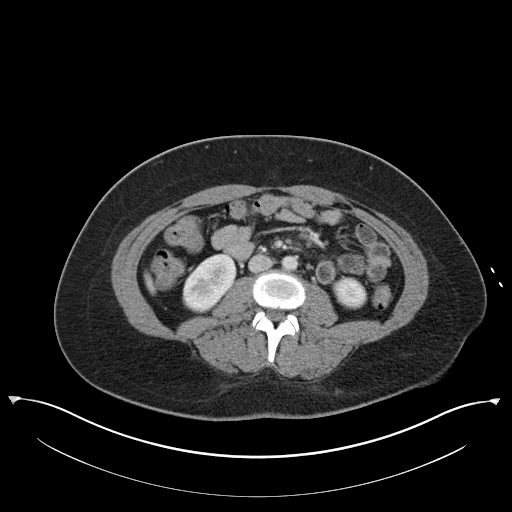
[im 54/89  soft-tissue]
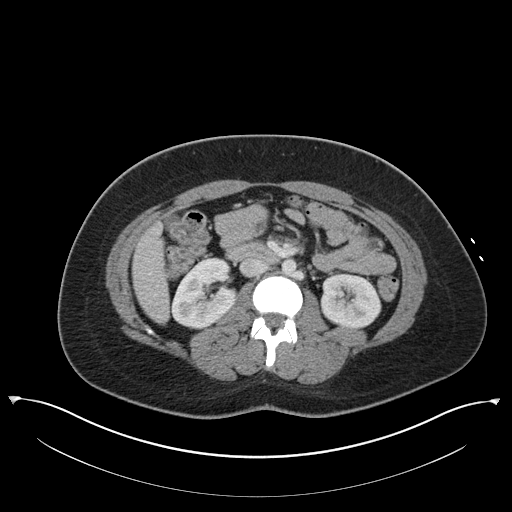
[im 54/89  bone]
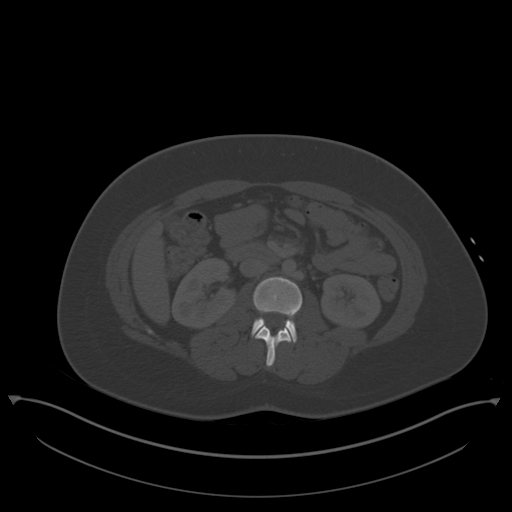
[im 59/89  soft-tissue]
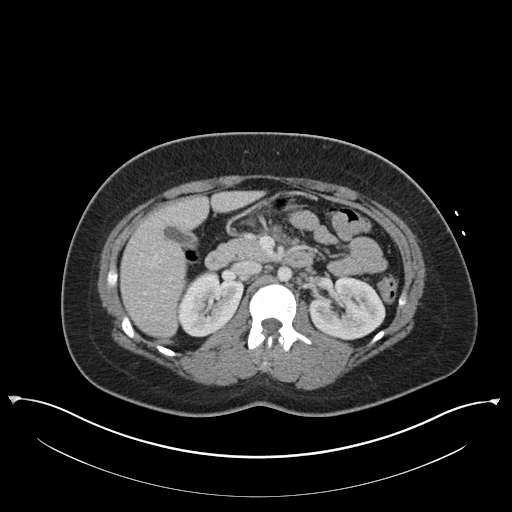
[im 64/89  soft-tissue]
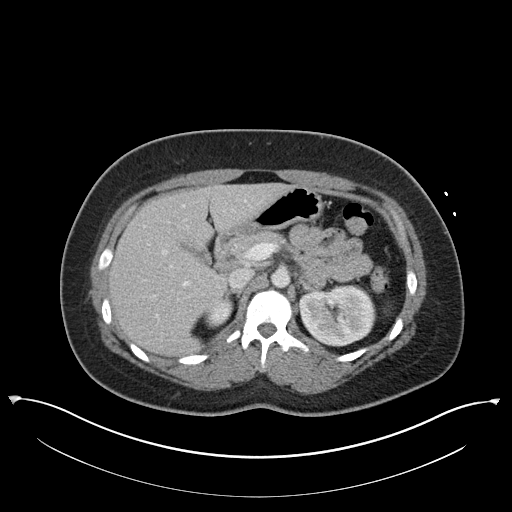
[im 69/89  soft-tissue]
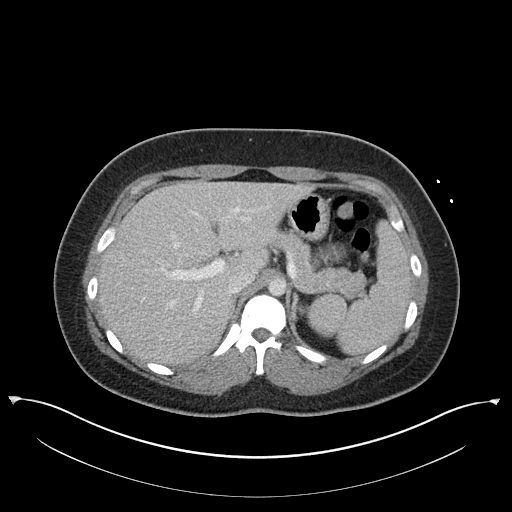
[im 79/89  soft-tissue]
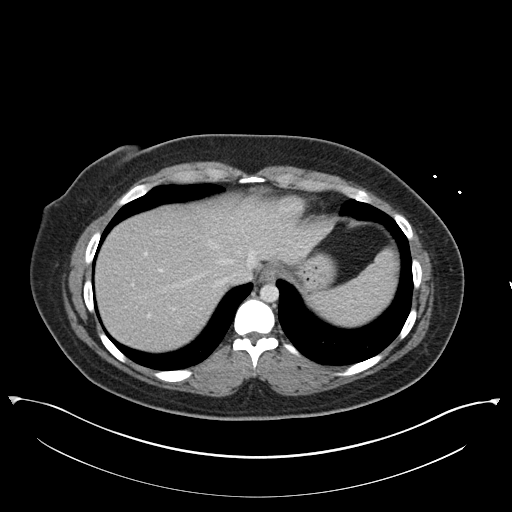
[im 84/89  soft-tissue]
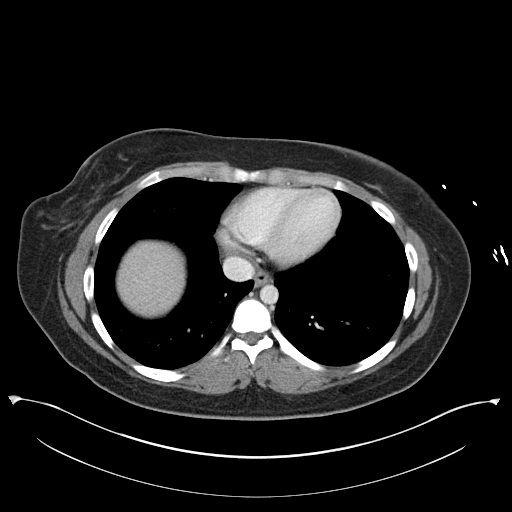

[Series 4: coronal st · coronal · 0.84mm/px · 3 of 136 slices shown]
[im 46/136  soft-tissue]
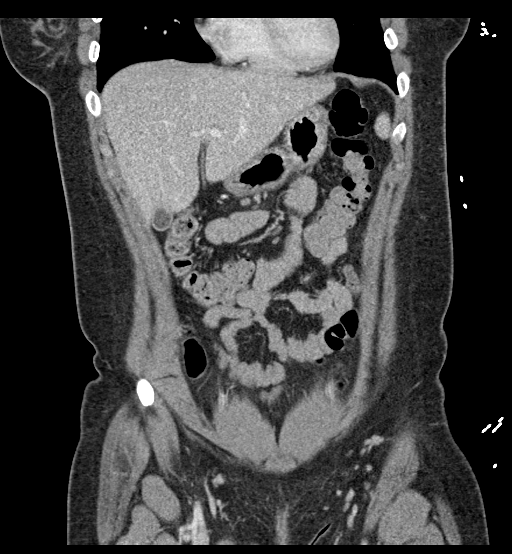
[im 61/136  soft-tissue]
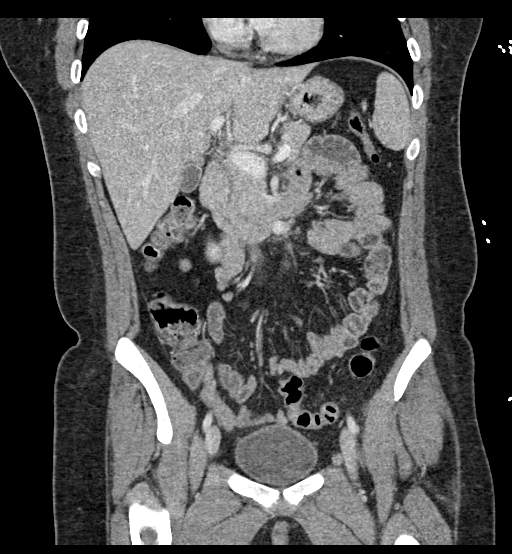
[im 76/136  soft-tissue]
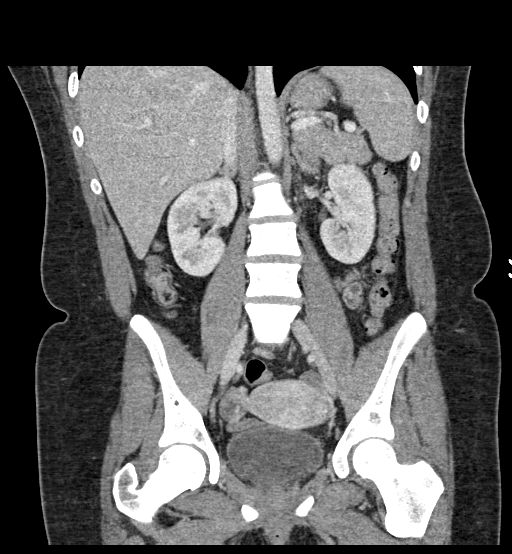

[17 of 46 positions shown; findings below may reference images not displayed]

FINDINGS: Lower chest: The visualized heart size within normal limits. No
pericardial fluid/thickening.

No hiatal hernia.

The visualized portions of the lungs are clear.

Hepatobiliary: The liver is normal in density without focal
abnormality.The main portal vein is patent. No evidence of calcified
gallstones, gallbladder wall thickening or biliary dilatation.

Pancreas: Unremarkable. No pancreatic ductal dilatation or
surrounding inflammatory changes.

Spleen: Normal in size without focal abnormality.

Adrenals/Urinary Tract: Both adrenal glands appear normal. The
kidneys and collecting system appear normal without evidence of
urinary tract calculus or hydronephrosis. Bladder is unremarkable.

Stomach/Bowel: The stomach, small bowel, and colon are normal in
appearance. No inflammatory changes, wall thickening, or obstructive
findings.The appendix is normal.

Vascular/Lymphatic: There are no enlarged mesenteric,
retroperitoneal, or pelvic lymph nodes. No significant vascular
findings are present.

Reproductive: Small amount of fluid seen within the endometrial
canal.

Other: No abdominal wall hernia is noted.

Musculoskeletal: No acute or significant osseous findings.
IMPRESSION: No acute intra-abdominal or pelvic pathology to explain the
patient's symptoms.

## 2022-10-22 DIAGNOSIS — H52223 Regular astigmatism, bilateral: Secondary | ICD-10-CM | POA: Diagnosis not present

## 2022-12-13 ENCOUNTER — Other Ambulatory Visit (HOSPITAL_COMMUNITY): Payer: Self-pay

## 2022-12-13 DIAGNOSIS — B3731 Acute candidiasis of vulva and vagina: Secondary | ICD-10-CM | POA: Diagnosis not present

## 2022-12-13 DIAGNOSIS — N898 Other specified noninflammatory disorders of vagina: Secondary | ICD-10-CM | POA: Diagnosis not present

## 2022-12-13 MED ORDER — NYSTATIN-TRIAMCINOLONE 100000-0.1 UNIT/GM-% EX OINT
1.0000 | TOPICAL_OINTMENT | Freq: Two times a day (BID) | CUTANEOUS | 0 refills | Status: AC
  Filled 2022-12-13: qty 30, 15d supply, fill #0

## 2022-12-13 MED ORDER — METRONIDAZOLE 500 MG PO TABS
500.0000 mg | ORAL_TABLET | Freq: Two times a day (BID) | ORAL | 0 refills | Status: AC
  Filled 2022-12-13: qty 14, 7d supply, fill #0

## 2022-12-21 ENCOUNTER — Other Ambulatory Visit (HOSPITAL_COMMUNITY): Payer: Self-pay

## 2023-03-12 ENCOUNTER — Other Ambulatory Visit (HOSPITAL_COMMUNITY): Payer: Self-pay

## 2023-03-12 MED ORDER — DROSPIRENONE-ETHINYL ESTRADIOL 3-0.03 MG PO TABS
1.0000 | ORAL_TABLET | Freq: Every day | ORAL | 0 refills | Status: DC
  Filled 2023-03-12: qty 84, 84d supply, fill #0

## 2023-03-25 ENCOUNTER — Other Ambulatory Visit (HOSPITAL_COMMUNITY): Payer: Self-pay

## 2023-03-25 DIAGNOSIS — L732 Hidradenitis suppurativa: Secondary | ICD-10-CM | POA: Diagnosis not present

## 2023-03-25 DIAGNOSIS — Z01419 Encounter for gynecological examination (general) (routine) without abnormal findings: Secondary | ICD-10-CM | POA: Diagnosis not present

## 2023-03-25 DIAGNOSIS — Z309 Encounter for contraceptive management, unspecified: Secondary | ICD-10-CM | POA: Diagnosis not present

## 2023-03-25 DIAGNOSIS — Z6833 Body mass index (BMI) 33.0-33.9, adult: Secondary | ICD-10-CM | POA: Diagnosis not present

## 2023-03-25 MED ORDER — DROSPIRENONE-ETHINYL ESTRADIOL 3-0.03 MG PO TABS
1.0000 | ORAL_TABLET | Freq: Every day | ORAL | 3 refills | Status: AC
  Filled 2023-05-27: qty 28, 28d supply, fill #0
  Filled 2023-06-21 (×2): qty 28, 28d supply, fill #1
  Filled 2023-07-24: qty 28, 28d supply, fill #2
  Filled 2023-08-21: qty 28, 28d supply, fill #3

## 2023-03-25 MED ORDER — CHLORHEXIDINE GLUCONATE 4 % EX SOLN
1.0000 | Freq: Two times a day (BID) | CUTANEOUS | 1 refills | Status: AC
  Filled 2023-03-25: qty 118, 4d supply, fill #0

## 2023-05-27 ENCOUNTER — Other Ambulatory Visit (HOSPITAL_COMMUNITY): Payer: Self-pay

## 2023-05-28 ENCOUNTER — Other Ambulatory Visit (HOSPITAL_COMMUNITY): Payer: Self-pay

## 2023-06-21 ENCOUNTER — Other Ambulatory Visit (HOSPITAL_COMMUNITY): Payer: Self-pay

## 2023-06-22 ENCOUNTER — Other Ambulatory Visit (HOSPITAL_COMMUNITY): Payer: Self-pay

## 2023-07-24 ENCOUNTER — Other Ambulatory Visit (HOSPITAL_COMMUNITY): Payer: Self-pay

## 2023-07-25 ENCOUNTER — Other Ambulatory Visit (HOSPITAL_COMMUNITY): Payer: Self-pay

## 2023-08-21 ENCOUNTER — Other Ambulatory Visit (HOSPITAL_COMMUNITY): Payer: Self-pay

## 2024-02-04 ENCOUNTER — Other Ambulatory Visit (HOSPITAL_COMMUNITY): Payer: Self-pay

## 2024-05-01 ENCOUNTER — Other Ambulatory Visit (HOSPITAL_COMMUNITY): Payer: Self-pay

## 2024-05-01 MED ORDER — DROSPIRENONE-ETHINYL ESTRADIOL 3-0.03 MG PO TABS
1.0000 | ORAL_TABLET | Freq: Every day | ORAL | 3 refills | Status: AC
  Filled 2024-05-01: qty 84, 84d supply, fill #0

## 2024-05-12 ENCOUNTER — Other Ambulatory Visit (HOSPITAL_COMMUNITY): Payer: Self-pay
